# Patient Record
Sex: Female | Born: 1955 | Race: White | Hispanic: No | Marital: Married | Smoking: Never smoker
Health system: Southern US, Community
[De-identification: ages and names within clinical notes are randomized; demographics above are authoritative.]

## PROBLEM LIST (undated history)

## (undated) DIAGNOSIS — M810 Age-related osteoporosis without current pathological fracture: Secondary | ICD-10-CM

## (undated) DIAGNOSIS — Z789 Other specified health status: Secondary | ICD-10-CM

## (undated) DIAGNOSIS — R319 Hematuria, unspecified: Secondary | ICD-10-CM

## (undated) DIAGNOSIS — D649 Anemia, unspecified: Secondary | ICD-10-CM

## (undated) DIAGNOSIS — T7840XA Allergy, unspecified, initial encounter: Secondary | ICD-10-CM

## (undated) DIAGNOSIS — N912 Amenorrhea, unspecified: Secondary | ICD-10-CM

## (undated) DIAGNOSIS — N200 Calculus of kidney: Secondary | ICD-10-CM

## (undated) DIAGNOSIS — H409 Unspecified glaucoma: Secondary | ICD-10-CM

## (undated) DIAGNOSIS — M8580 Other specified disorders of bone density and structure, unspecified site: Secondary | ICD-10-CM

## (undated) HISTORY — DX: Calculus of kidney: N20.0

## (undated) HISTORY — DX: Unspecified glaucoma: H40.9

## (undated) HISTORY — DX: Hematuria, unspecified: R31.9

## (undated) HISTORY — DX: Amenorrhea, unspecified: N91.2

## (undated) HISTORY — DX: Allergy, unspecified, initial encounter: T78.40XA

## (undated) HISTORY — PX: TUBAL LIGATION: SHX77

## (undated) HISTORY — PX: COLONOSCOPY: SHX174

## (undated) HISTORY — DX: Other specified disorders of bone density and structure, unspecified site: M85.80

---

## 1999-04-03 ENCOUNTER — Other Ambulatory Visit: Admission: RE | Admit: 1999-04-03 | Discharge: 1999-04-03 | Payer: Self-pay | Admitting: Obstetrics and Gynecology

## 2000-10-13 ENCOUNTER — Other Ambulatory Visit: Admission: RE | Admit: 2000-10-13 | Discharge: 2000-10-13 | Payer: Self-pay | Admitting: Obstetrics and Gynecology

## 2001-12-01 ENCOUNTER — Other Ambulatory Visit: Admission: RE | Admit: 2001-12-01 | Discharge: 2001-12-01 | Payer: Self-pay | Admitting: Obstetrics and Gynecology

## 2003-01-03 ENCOUNTER — Other Ambulatory Visit: Admission: RE | Admit: 2003-01-03 | Discharge: 2003-01-03 | Payer: Self-pay | Admitting: Obstetrics and Gynecology

## 2004-05-22 ENCOUNTER — Other Ambulatory Visit: Admission: RE | Admit: 2004-05-22 | Discharge: 2004-05-22 | Payer: Self-pay | Admitting: Obstetrics and Gynecology

## 2005-08-11 ENCOUNTER — Other Ambulatory Visit: Admission: RE | Admit: 2005-08-11 | Discharge: 2005-08-11 | Payer: Self-pay | Admitting: Obstetrics and Gynecology

## 2005-08-26 ENCOUNTER — Encounter: Admission: RE | Admit: 2005-08-26 | Discharge: 2005-08-26 | Payer: Self-pay | Admitting: Obstetrics and Gynecology

## 2005-12-16 ENCOUNTER — Emergency Department (HOSPITAL_COMMUNITY): Admission: EM | Admit: 2005-12-16 | Discharge: 2005-12-16 | Payer: Self-pay | Admitting: Emergency Medicine

## 2005-12-20 ENCOUNTER — Ambulatory Visit: Payer: Self-pay | Admitting: Cardiovascular Disease

## 2006-01-03 ENCOUNTER — Ambulatory Visit: Payer: Self-pay | Admitting: Cardiovascular Disease

## 2006-01-03 ENCOUNTER — Encounter: Payer: Self-pay | Admitting: Internal Medicine

## 2006-01-03 ENCOUNTER — Ambulatory Visit: Payer: Self-pay

## 2007-01-31 ENCOUNTER — Encounter: Admission: RE | Admit: 2007-01-31 | Discharge: 2007-01-31 | Payer: Self-pay | Admitting: Obstetrics & Gynecology

## 2007-02-02 ENCOUNTER — Encounter: Admission: RE | Admit: 2007-02-02 | Discharge: 2007-02-02 | Payer: Self-pay | Admitting: Obstetrics & Gynecology

## 2007-04-27 ENCOUNTER — Other Ambulatory Visit: Admission: RE | Admit: 2007-04-27 | Discharge: 2007-04-27 | Payer: Self-pay | Admitting: Obstetrics & Gynecology

## 2007-05-03 ENCOUNTER — Encounter: Admission: RE | Admit: 2007-05-03 | Discharge: 2007-05-03 | Payer: Self-pay | Admitting: Obstetrics & Gynecology

## 2007-08-30 ENCOUNTER — Encounter: Admission: RE | Admit: 2007-08-30 | Discharge: 2007-08-30 | Payer: Self-pay | Admitting: Obstetrics & Gynecology

## 2009-01-27 ENCOUNTER — Encounter: Admission: RE | Admit: 2009-01-27 | Discharge: 2009-01-27 | Payer: Self-pay | Admitting: Obstetrics & Gynecology

## 2009-01-27 ENCOUNTER — Other Ambulatory Visit: Admission: RE | Admit: 2009-01-27 | Discharge: 2009-01-27 | Payer: Self-pay | Admitting: Obstetrics & Gynecology

## 2010-03-09 ENCOUNTER — Encounter: Admission: RE | Admit: 2010-03-09 | Discharge: 2010-03-09 | Payer: Self-pay | Admitting: Obstetrics & Gynecology

## 2010-03-24 ENCOUNTER — Encounter (INDEPENDENT_AMBULATORY_CARE_PROVIDER_SITE_OTHER): Payer: Self-pay | Admitting: *Deleted

## 2010-05-13 ENCOUNTER — Encounter (INDEPENDENT_AMBULATORY_CARE_PROVIDER_SITE_OTHER): Payer: Self-pay | Admitting: *Deleted

## 2010-05-15 ENCOUNTER — Ambulatory Visit: Payer: Self-pay | Admitting: Internal Medicine

## 2010-05-29 ENCOUNTER — Ambulatory Visit: Payer: Self-pay | Admitting: Internal Medicine

## 2010-10-18 ENCOUNTER — Encounter: Payer: Self-pay | Admitting: Obstetrics & Gynecology

## 2010-10-29 NOTE — Letter (Signed)
Summary: Previsit letter  [facility]  [address] [place], [place] [address]   Phone: [phone]  Fax: [phone]       03/24/2010 MRN: [id number]  [facility] [address] [place], [place]  [address]  Dear Ms. Colleen Elliott,  Welcome to the Gastroenterology Division at [facility].    You are scheduled to see a nurse for your pre-procedure visit on 05-15-10 at 4:30p.m. on the 3rd floor at [facility], [address]  We ask that you try to arrive at our office 15 minutes prior to your appointment time to allow for check-in.  Your nurse visit will consist of discussing your medical and surgical history, your immediate family medical history, and your medications.    Please bring a complete list of all your medications or, if you prefer, bring the medication bottles and we will list them.  We will need to be aware of both prescribed and over the counter drugs.  We will need to know exact dosage information as well.  If you are on blood thinners (Coumadin, Plavix, Aggrenox, Ticlid, etc.) please call our office today/prior to your appointment, as we need to consult with your physician about holding your medication.   Please be prepared to read and sign documents such as consent forms, a financial agreement, and acknowledgement forms.  If necessary, and with your consent, a friend or relative is welcome to sit-in on the nurse visit with you.  Please bring your insurance card so that we may make a copy of it.  If your insurance requires a referral to see a specialist, please bring your referral form from your primary care physician.  No co-pay is required for this nurse visit.     If you cannot keep your appointment, please call [phone] to cancel or reschedule prior to your appointment date.  This allows [place] the opportunity to schedule an appointment for another patient in need of care.    Thank you for choosing [facility] for your medical needs.   We appreciate the opportunity to care for you.  Please visit [place] at our website  to learn more about our practice.                     Sincerely.                                                                                                                   The Gastroenterology Division

## 2010-10-29 NOTE — Miscellaneous (Signed)
Summary: Previsit prep/RM  Clinical Lists Changes  Medications: Added new medication of MOVIPREP 100 GM  SOLR (PEG-KCL-NACL-NASULF-NA ASC-C) As per prep instructions. - Signed Rx of MOVIPREP 100 GM  SOLR (PEG-KCL-NACL-NASULF-NA ASC-C) As per prep instructions.;  #1 x 0;  Signed;  Entered by: Sherren Kerns RN;  Authorized by: Iva Boop MD, [facility];  Method used: Electronically to [facility] [phone]*, [address] [place], [place], [place]  [address], Ph: [phone] or [phone], Fax: [phone] Observations: Added new observation of ALLERGY REV: Done (05/15/2010 16:18) Added new observation of NKA: T (05/15/2010 16:18)    Prescriptions: MOVIPREP 100 GM  SOLR (PEG-KCL-NACL-NASULF-NA ASC-C) As per prep instructions.  #1 x 0   Entered by:   Sherren Kerns RN   Authorized by:   Iva Boop MD, [facility]   Signed by:   Sherren Kerns RN on 05/15/2010   Method used:   Electronically to        [facility] [phone]* (retail)       [address]       [place], [place]  [address]       Ph: [phone] or [phone]       Fax: [phone]   RxID:   [id number]

## 2010-10-29 NOTE — Procedures (Signed)
Summary: Colonoscopy  Patient: Colleen Elliott Note: All result statuses are Final unless otherwise noted.  Tests: (1) Colonoscopy (COL)   COL Colonoscopy           DONE     [facility]     [address]     [place], [place]  [address]           COLONOSCOPY PROCEDURE REPORT           PATIENT:  Colleen Elliott, Colleen Elliott  MR#:  [id number]     BIRTHDATE:  03-Aug-1956, 54 yrs. old  GENDER:  female     ENDOSCOPIST:  Iva Boop, MD, [facility]     REF. BY:  Leda Quail, M.D.     PROCEDURE DATE:  05/29/2010     PROCEDURE:  Colonoscopy 19[address]     ASA CLASS:  Class I     INDICATIONS:  Routine Risk Screening     MEDICATIONS:   Fentanyl 50 mcg IV, Versed 5 mg IV           DESCRIPTION OF PROCEDURE:   After the risks benefits and     alternatives of the procedure were thoroughly explained, informed     consent was obtained.  Digital rectal exam was performed and     revealed no abnormalities.   The LB PCF-H180AL [id number] endoscope     was introduced through the anus and advanced to the cecum, which     was identified by both the appendix and ileocecal valve, without     limitations.  The quality of the prep was excellent, using     MoviPrep.  The instrument was then slowly withdrawn as the colon     was fully examined. Insertion: 4:01 minutes Withdrawal: 6:48     minutes     <<PROCEDUREIMAGES>>           FINDINGS:  Mild diverticulosis was found in the right colon.  This     was otherwise a normal examination of the colon. Including right     colon retroflexion.   Retroflexed views in the rectum revealed no     abnormalities.    The scope was then withdrawn from the patient     and the procedure completed.           COMPLICATIONS:  None     ENDOSCOPIC IMPRESSION:     1) Mild diverticulosis in the right colon     2) Otherwise normal examination with excellent prep           REPEAT EXAM:  In 10 year(s) for routine screening colonoscopy.           Iva Boop, MD, [facility]           CC:   Leda Quail, MD and The Patient           n.     eSIGNED:   Iva Boop at 05/29/2010 09:19 AM           Zoe Lan, [id number]  Note: An exclamation mark (!) indicates a result that was not dispersed into the flowsheet. Document Creation Date: 05/29/2010 9:19 AM _______________________________________________________________________  (1) Order result status: Final Collection or observation date-time: 05/29/2010 09:10 Requested date-time:  Receipt date-time:  Reported date-time:  Referring Physician:   Ordering Physician: Stan Head [phone]) Specimen Source:  Source: Launa Grill Order Number: [id number] Lab site:   Appended Document: Colonoscopy    Clinical Lists Changes  Observations: Added new  observation of COLONNXTDUE: 05/2020 (05/29/2010 10:05)

## 2010-10-29 NOTE — Letter (Signed)
Summary: [facility] Instructions  [facility]  [address] [place], [place] [address]   Phone: [phone]  Fax: [phone]       SHINITA MAC    [birth year]    MRN: [id number]        Procedure Day /Date:  Friday 05/29/2010     Arrival Time: 8:00 am      Procedure Time: 9:00 am     Location of Procedure:                    _ x_  [facility] (4th Floor)                        PREPARATION FOR COLONOSCOPY WITH MOVIPREP   Starting 5 days prior to your procedure Sunday 8/28 do not eat nuts, seeds, popcorn, corn, beans, peas,  salads, or any raw vegetables.  Do not take any fiber supplements (e.g. Metamucil, Citrucel, and Benefiber).  THE DAY BEFORE YOUR PROCEDURE         DATE: Thursday 9/1  1.  Drink clear liquids the entire day-NO SOLID FOOD  2.  Do not drink anything colored red or purple.  Avoid juices with pulp.  No orange juice.  3.  Drink at least 64 oz. (8 glasses) of fluid/clear liquids during the day to prevent dehydration and help the prep work efficiently.  CLEAR LIQUIDS INCLUDE: Water Jello Ice Popsicles Tea (sugar ok, no milk/cream) Powdered fruit flavored drinks Coffee (sugar ok, no milk/cream) Gatorade Juice: apple, white grape, white cranberry  Lemonade Clear bullion, consomm, broth Carbonated beverages (any kind) Strained chicken noodle soup Hard Candy                             4.  In the morning, mix first dose of MoviPrep solution:    Empty 1 Pouch A and 1 Pouch B into the disposable container    Add lukewarm drinking water to the top line of the container. Mix to dissolve    Refrigerate (mixed solution should be used within 24 hrs)  5.  Begin drinking the prep at 5:00 p.m. The MoviPrep container is divided by 4 marks.   Every 15 minutes drink the solution down to the next mark (approximately 8 oz) until the full liter is complete.   6.  Follow completed prep with 16 oz of clear liquid of your choice (Nothing red  or purple).  Continue to drink clear liquids until bedtime.  7.  Before going to bed, mix second dose of MoviPrep solution:    Empty 1 Pouch A and 1 Pouch B into the disposable container    Add lukewarm drinking water to the top line of the container. Mix to dissolve    Refrigerate  THE DAY OF YOUR PROCEDURE      DATE: Friday 9/2  Beginning at 4:00 a.m. (5 hours before procedure):         1. Every 15 minutes, drink the solution down to the next mark (approx 8 oz) until the full liter is complete.  2. Follow completed prep with 16 oz. of clear liquid of your choice.    3. You may drink clear liquids until 7:00 am (2 HOURS BEFORE PROCEDURE).   MEDICATION INSTRUCTIONS  Unless otherwise instructed, you should take regular prescription medications with a small sip of water   as early as possible the morning of  your procedure.   Additional medication instructions: n/a         OTHER INSTRUCTIONS  You will need a responsible adult at least 55 years of age to accompany you and drive you home.   This person must remain in the waiting room during your procedure.  Wear loose fitting clothing that is easily removed.  Leave jewelry and other valuables at home.  However, you may wish to bring a book to read or  an iPod/MP3 player to listen to music as you wait for your procedure to start.  Remove all body piercing jewelry and leave at home.  Total time from sign-in until discharge is approximately 2-3 hours.  You should go home directly after your procedure and rest.  You can resume normal activities the  day after your procedure.  The day of your procedure you should not:   Drive   Make legal decisions   Operate machinery   Drink alcohol   Return to work  You will receive specific instructions about eating, activities and medications before you leave.    The above instructions have been reviewed and explained to me by   Sherren Kerns RN  May 15, 2010 4:34  PM    I fully understand and can verbalize these instructions _____________________________ Date _________

## 2011-08-03 ENCOUNTER — Other Ambulatory Visit: Payer: Self-pay | Admitting: Obstetrics & Gynecology

## 2011-08-03 DIAGNOSIS — M8580 Other specified disorders of bone density and structure, unspecified site: Secondary | ICD-10-CM

## 2011-08-03 DIAGNOSIS — Z1231 Encounter for screening mammogram for malignant neoplasm of breast: Secondary | ICD-10-CM

## 2011-08-23 ENCOUNTER — Ambulatory Visit: Payer: Self-pay

## 2011-09-08 ENCOUNTER — Ambulatory Visit
Admission: RE | Admit: 2011-09-08 | Discharge: 2011-09-08 | Disposition: A | Discharge status: Home / Self Care | Payer: Self-pay | Source: Ambulatory Visit | Attending: Obstetrics & Gynecology | Admitting: Obstetrics & Gynecology

## 2011-09-08 DIAGNOSIS — M8580 Other specified disorders of bone density and structure, unspecified site: Secondary | ICD-10-CM

## 2011-09-08 DIAGNOSIS — Z1231 Encounter for screening mammogram for malignant neoplasm of breast: Secondary | ICD-10-CM

## 2012-02-28 ENCOUNTER — Emergency Department (HOSPITAL_COMMUNITY)
Admission: EM | Admit: 2012-02-28 | Discharge: 2012-02-28 | Disposition: A | Discharge status: Home / Self Care | Payer: 59 | Attending: Emergency Medicine | Admitting: Emergency Medicine

## 2012-02-28 ENCOUNTER — Emergency Department (HOSPITAL_COMMUNITY): Payer: 59

## 2012-02-28 ENCOUNTER — Encounter (HOSPITAL_COMMUNITY): Payer: Self-pay | Admitting: *Deleted

## 2012-02-28 DIAGNOSIS — N390 Urinary tract infection, site not specified: Secondary | ICD-10-CM | POA: Insufficient documentation

## 2012-02-28 DIAGNOSIS — R110 Nausea: Secondary | ICD-10-CM | POA: Insufficient documentation

## 2012-02-28 DIAGNOSIS — R109 Unspecified abdominal pain: Secondary | ICD-10-CM | POA: Insufficient documentation

## 2012-02-28 DIAGNOSIS — R10816 Epigastric abdominal tenderness: Secondary | ICD-10-CM | POA: Insufficient documentation

## 2012-02-28 LAB — URINALYSIS, ROUTINE W REFLEX MICROSCOPIC
Bilirubin Urine: NEGATIVE
Ketones, ur: NEGATIVE mg/dL
Nitrite: NEGATIVE
Specific Gravity, Urine: 1.011 (ref 1.005–1.030)
Urobilinogen, UA: 0.2 mg/dL (ref 0.0–1.0)

## 2012-02-28 LAB — COMPREHENSIVE METABOLIC PANEL
ALT: 31 U/L (ref 0–35)
AST: 45 U/L — ABNORMAL HIGH (ref 0–37)
Alkaline Phosphatase: 81 U/L (ref 39–117)
CO2: 28 mEq/L (ref 19–32)
Calcium: 9.3 mg/dL (ref 8.4–10.5)
Chloride: 104 mEq/L (ref 96–112)
GFR calc Af Amer: >90 mL/min (ref >90)
GFR calc non Af Amer: >90 mL/min (ref >90)
Glucose, Bld: 87 mg/dL (ref 70–99)
Potassium: 3.9 mEq/L (ref 3.5–5.1)
Sodium: 140 mEq/L (ref 135–145)
Total Bilirubin: 0.2 mg/dL — ABNORMAL LOW (ref 0.3–1.2)

## 2012-02-28 LAB — DIFFERENTIAL
Basophils Absolute: 0 10*3/uL (ref 0.0–0.1)
Eosinophils Relative: 2 % (ref 0–5)
Lymphocytes Relative: 27 % (ref 12–46)
Lymphs Abs: 2 10*3/uL (ref 0.7–4.0)
Neutro Abs: 4.9 10*3/uL (ref 1.7–7.7)

## 2012-02-28 LAB — CBC
MCV: 94 fL (ref 78.0–100.0)
Platelets: 281 10*3/uL (ref 150–400)
RBC: 3.84 MIL/uL — ABNORMAL LOW (ref 3.87–5.11)
RDW: 13.2 % (ref 11.5–15.5)
WBC: 7.4 10*3/uL (ref 4.0–10.5)

## 2012-02-28 LAB — URINE MICROSCOPIC-ADD ON

## 2012-02-28 MED ORDER — ONDANSETRON HCL 4 MG/2ML IJ SOLN
4.0000 mg | Freq: Once | INTRAMUSCULAR | Status: AC
  Administered 2012-02-28: 4 mg via INTRAVENOUS
  Filled 2012-02-28: qty 2

## 2012-02-28 MED ORDER — CEPHALEXIN 500 MG PO CAPS: 500.0000 mg | ORAL_CAPSULE | Freq: Three times a day (TID) | ORAL | Status: DC

## 2012-02-28 MED ORDER — DEXTROSE 5 % IV SOLN
1.0000 g | INTRAVENOUS | Status: DC
  Administered 2012-02-28: 1 g via INTRAVENOUS
  Filled 2012-02-28: qty 10

## 2012-02-28 MED ORDER — HYDROMORPHONE HCL PF 1 MG/ML IJ SOLN
1.0000 mg | Freq: Once | INTRAMUSCULAR | Status: AC
  Administered 2012-02-28: 1 mg via INTRAVENOUS
  Filled 2012-02-28: qty 1

## 2012-02-28 MED ORDER — HYDROCODONE-ACETAMINOPHEN 5-325 MG PO TABS: 1.0000 | ORAL_TABLET | Freq: Four times a day (QID) | ORAL | Status: DC | PRN

## 2012-02-28 MED ORDER — SODIUM CHLORIDE 0.9 % IV SOLN
1000.0000 mL | Freq: Once | INTRAVENOUS | Status: AC
  Administered 2012-02-28: 1000 mL via INTRAVENOUS

## 2012-02-28 MED ORDER — SODIUM CHLORIDE 0.9 % IV SOLN
1000.0000 mL | INTRAVENOUS | Status: DC
  Administered 2012-02-28: 1000 mL via INTRAVENOUS

## 2012-02-28 NOTE — ED Provider Notes (Addendum)
History     CSN: [id number] Arrival date & time 02/28/12  1414 First MD Initiated Contact with Patient 02/28/12 1504      Chief Complaint  Patient presents with  . Abdominal Pain    pt is here with epigastric pain radiating to back    Patient is a 56 y.o. female presenting with abdominal pain. The history is provided by the patient.  Abdominal Pain The primary symptoms of the illness include abdominal pain and nausea. The primary symptoms of the illness do not include vomiting or diarrhea.  The abdominal pain began yesterday. The pain came on suddenly. The abdominal pain is located in the epigastric region. The abdominal pain radiates to the back. Relieved by: it eventually resolved after zantac and walking. Exacerbated by: no change with food, able to eat lunch and breakfast, started again this afternoon at 1:30.  Additional symptoms associated with the illness include diaphoresis. Symptoms associated with the illness do not include chills, anorexia, urgency, hematuria or frequency.    History reviewed. No pertinent past medical history. Hx of hematuria over the years, negative workup Past Surgical History  Procedure Date  . Tubal ligation     No family history on file.  History  Substance Use Topics  . Smoking status: Not on file  . Smokeless tobacco: Not on file  . Alcohol Use: No    OB History    Grav Para Term Preterm Abortions TAB SAB Ect Mult Living                  Review of Systems  Constitutional: Positive for diaphoresis. Negative for chills.  Respiratory: Negative for cough.   Gastrointestinal: Positive for nausea and abdominal pain. Negative for vomiting, diarrhea and anorexia.  Genitourinary: Negative for urgency, frequency and hematuria.  All other systems reviewed and are negative.    Allergies  Review of patient's allergies indicates no known allergies.  Home Medications   Current Outpatient Rx  Name Route Sig Dispense Refill  . CALCIUM  CARBONATE-VITAMIN D 500-200 MG-UNIT PO TABS Oral Take 1 tablet by mouth every other day.    . CHLORPHEN-PE-ACETAMINOPHEN 2-5-325 MG PO TABS Oral Take 1 tablet by mouth every 6 (six) hours as needed. For sinus pain      BP 135/80  Pulse 77  Temp(Src) 98.8 F (37.1 C) (Oral)  Resp 17  Ht 5\' 1"  (1.549 m)  Wt 114 lb (51.71 kg)  BMI 21.54 kg/m2  SpO2 100%  Physical Exam  Nursing note and vitals reviewed. Constitutional: She appears well-developed and well-nourished. No distress.  HENT:  Head: Normocephalic and atraumatic.  Right Ear: External ear normal.  Left Ear: External ear normal.  Eyes: Conjunctivae are normal. Right eye exhibits no discharge. Left eye exhibits no discharge. No scleral icterus.  Neck: Neck supple. No tracheal deviation present.  Cardiovascular: Normal rate, regular rhythm and intact distal pulses.   Pulmonary/Chest: Effort normal and breath sounds normal. No stridor. No respiratory distress. She has no wheezes. She has no rales.  Abdominal: Soft. Bowel sounds are normal. She exhibits no distension, no ascites and no mass. There is tenderness in the epigastric area. There is CVA tenderness (left). There is no rigidity, no rebound and no guarding. No hernia.  Musculoskeletal: She exhibits no edema and no tenderness.  Neurological: She is alert. She has normal strength. No sensory deficit. Cranial nerve deficit:  no gross defecits noted. She exhibits normal muscle tone. She displays no seizure activity. Coordination normal.  Skin: Skin is warm and dry. No rash noted.  Psychiatric: She has a normal mood and affect.    ED Course  Procedures (including critical care time)  Rate: 76  Rhythm: normal sinus rhythm  QRS Axis: normal  Intervals: normal  ST/T Wave abnormalities: normal  Conduction Disutrbances:none  Narrative Interpretation:   Old EKG Reviewed: none available  Labs Reviewed  CBC - Abnormal; Notable for the following:    RBC 3.84 (*)    Hemoglobin  11.6 (*)    All other components within normal limits  COMPREHENSIVE METABOLIC PANEL - Abnormal; Notable for the following:    AST 45 (*)    Total Bilirubin 0.2 (*)    All other components within normal limits  URINALYSIS, ROUTINE W REFLEX MICROSCOPIC - Abnormal; Notable for the following:    Hgb urine dipstick SMALL (*)    Leukocytes, UA MODERATE (*)    All other components within normal limits  URINE MICROSCOPIC-ADD ON - Abnormal; Notable for the following:    Bacteria, UA MANY (*)    All other components within normal limits  DIFFERENTIAL  LIPASE, BLOOD  URINE CULTURE   US Abdomen Complete  02/28/2012  *RADIOLOGY REPORT*  Clinical Data:  Abdominal and epigastric pain.  ABDOMINAL ULTRASOUND COMPLETE  Comparison:  None.  Findings:  Gallbladder:  No gallstones, gallbladder wall thickening, or pericholecystic fluid. The sonographic Murphy's sign is negative.  Common Bile Duct:  Within normal limits in caliber.  Liver: No focal mass lesion identified.  Within normal limits in parenchymal echogenicity.  IVC:  Appears normal.  Pancreas:  No abnormality identified.  Spleen:  Within normal limits in size and echotexture.  Right kidney:  Normal in size and parenchymal echogenicity.  No evidence of mass or hydronephrosis.  Left kidney:  Normal in size and parenchymal echogenicity.  No evidence of mass or hydronephrosis.  Abdominal Aorta:  No aneurysm identified.  IMPRESSION: Negative abdominal ultrasound.  Original Report Authenticated By: Britta Mccreedy, M.D.     1. Abdominal pain   2. UTI (urinary tract infection)       MDM  No sign of cholecystitis or other acute abnormality on the [place].  Suspect her symptoms may be related to a UTI.  Will dc home on abx.  Pt instructed to return for worsening symptoms.        Celene Kras, MD 02/29/12 5784  Celene Kras, MD 03/29/12 [phone]

## 2012-02-28 NOTE — ED Notes (Signed)
Pt is here with epigastric pain which is radiating to the back and is associated with nausea and dizziness while pain was at its worst.  Pain first felt last night but subsided.  This pain returned this afternoon around 1pm and felt like stabbing pain.  Pt notes that xantac and ambulation seemed to help with this pain last night.  No sob or diaphoresis with this.

## 2012-02-28 NOTE — Discharge Instructions (Signed)

## 2012-02-28 NOTE — Progress Notes (Signed)
pt confirms Dr Christell Constant in [place] is pcp

## 2012-03-01 ENCOUNTER — Encounter (HOSPITAL_COMMUNITY): Payer: Self-pay | Admitting: Emergency Medicine

## 2012-03-01 ENCOUNTER — Inpatient Hospital Stay (HOSPITAL_COMMUNITY)
Admission: EM | Admit: 2012-03-01 | Discharge: 2012-03-04 | DRG: 442 | Disposition: A | Discharge status: Home / Self Care | Payer: 59 | Attending: Internal Medicine | Admitting: Internal Medicine

## 2012-03-01 DIAGNOSIS — R1013 Epigastric pain: Secondary | ICD-10-CM | POA: Diagnosis present

## 2012-03-01 DIAGNOSIS — D6489 Other specified anemias: Secondary | ICD-10-CM | POA: Diagnosis present

## 2012-03-01 DIAGNOSIS — R7401 Elevation of levels of liver transaminase levels: Secondary | ICD-10-CM | POA: Diagnosis present

## 2012-03-01 DIAGNOSIS — K754 Autoimmune hepatitis: Principal | ICD-10-CM | POA: Diagnosis present

## 2012-03-01 DIAGNOSIS — R1110 Vomiting, unspecified: Secondary | ICD-10-CM

## 2012-03-01 DIAGNOSIS — R109 Unspecified abdominal pain: Secondary | ICD-10-CM

## 2012-03-01 DIAGNOSIS — R7402 Elevation of levels of lactic acid dehydrogenase (LDH): Secondary | ICD-10-CM | POA: Diagnosis present

## 2012-03-01 DIAGNOSIS — B199 Unspecified viral hepatitis without hepatic coma: Secondary | ICD-10-CM | POA: Diagnosis present

## 2012-03-01 DIAGNOSIS — N390 Urinary tract infection, site not specified: Secondary | ICD-10-CM | POA: Diagnosis present

## 2012-03-01 DIAGNOSIS — R112 Nausea with vomiting, unspecified: Secondary | ICD-10-CM | POA: Diagnosis present

## 2012-03-01 DIAGNOSIS — B179 Acute viral hepatitis, unspecified: Secondary | ICD-10-CM | POA: Diagnosis present

## 2012-03-01 HISTORY — DX: Anemia, unspecified: D64.9

## 2012-03-01 HISTORY — DX: Other specified health status: Z78.9

## 2012-03-01 NOTE — ED Notes (Signed)
Pt was seen Monday for suspected gallbladder issues. Pt has had nausea and pain since she left and feels as if the pain medication may have contributed to her nausea. States that the medication gave her no relief. Does not seem to get better or worse with food.

## 2012-03-02 ENCOUNTER — Emergency Department (HOSPITAL_COMMUNITY): Payer: 59

## 2012-03-02 ENCOUNTER — Inpatient Hospital Stay (HOSPITAL_COMMUNITY): Payer: 59

## 2012-03-02 ENCOUNTER — Encounter (HOSPITAL_COMMUNITY): Payer: Self-pay | Admitting: Neurology

## 2012-03-02 DIAGNOSIS — R1013 Epigastric pain: Secondary | ICD-10-CM | POA: Diagnosis present

## 2012-03-02 DIAGNOSIS — K759 Inflammatory liver disease, unspecified: Secondary | ICD-10-CM

## 2012-03-02 DIAGNOSIS — E782 Mixed hyperlipidemia: Secondary | ICD-10-CM

## 2012-03-02 DIAGNOSIS — R112 Nausea with vomiting, unspecified: Secondary | ICD-10-CM

## 2012-03-02 DIAGNOSIS — R7401 Elevation of levels of liver transaminase levels: Secondary | ICD-10-CM | POA: Clinically undetermined

## 2012-03-02 LAB — CBC
HCT: 32.2 % — ABNORMAL LOW (ref 36.0–46.0)
Hemoglobin: 11.4 g/dL — ABNORMAL LOW (ref 12.0–15.0)
Hemoglobin: 10.4 g/dL — ABNORMAL LOW (ref 12.0–15.0)
MCH: 29.9 pg (ref 26.0–34.0)
MCHC: 32.2 g/dL (ref 30.0–36.0)
MCV: 92.9 fL (ref 78.0–100.0)
MCV: 92.8 fL (ref 78.0–100.0)
Platelets: 271 10*3/uL (ref 150–400)
RBC: 3.81 MIL/uL — ABNORMAL LOW (ref 3.87–5.11)
RBC: 3.47 MIL/uL — ABNORMAL LOW (ref 3.87–5.11)
WBC: 5.6 10*3/uL (ref 4.0–10.5)

## 2012-03-02 LAB — COMPREHENSIVE METABOLIC PANEL
AST: 190 U/L — ABNORMAL HIGH (ref 0–37)
Albumin: 3.2 g/dL — ABNORMAL LOW (ref 3.5–5.2)
BUN: 9 mg/dL (ref 6–23)
CO2: 27 mEq/L (ref 19–32)
Calcium: 9.3 mg/dL (ref 8.4–10.5)
Creatinine, Ser: 0.6 mg/dL (ref 0.50–1.10)
Creatinine, Ser: 0.65 mg/dL (ref 0.50–1.10)
GFR calc Af Amer: >90 mL/min (ref >90)
GFR calc non Af Amer: >90 mL/min (ref >90)
Glucose, Bld: 118 mg/dL — ABNORMAL HIGH (ref 70–99)
Total Protein: 7.1 g/dL (ref 6.0–8.3)
Total Protein: 6 g/dL (ref 6.0–8.3)

## 2012-03-02 LAB — DIFFERENTIAL
Eosinophils Relative: 1 % (ref 0–5)
Lymphocytes Relative: 37 % (ref 12–46)
Lymphs Abs: 2 10*3/uL (ref 0.7–4.0)
Monocytes Absolute: 0.4 10*3/uL (ref 0.1–1.0)
Monocytes Relative: 7 % (ref 3–12)

## 2012-03-02 LAB — URINALYSIS, ROUTINE W REFLEX MICROSCOPIC
Bilirubin Urine: NEGATIVE
Ketones, ur: NEGATIVE mg/dL
Protein, ur: NEGATIVE mg/dL
Urobilinogen, UA: 1 mg/dL (ref 0.0–1.0)

## 2012-03-02 LAB — POCT I-STAT, CHEM 8
Chloride: 103 mEq/L (ref 96–112)
HCT: 36 % (ref 36.0–46.0)
Hemoglobin: 12.2 g/dL (ref 12.0–15.0)
Potassium: 3.8 mEq/L (ref 3.5–5.1)
Sodium: 141 mEq/L (ref 135–145)

## 2012-03-02 LAB — URINE MICROSCOPIC-ADD ON

## 2012-03-02 MED ORDER — ENOXAPARIN SODIUM 40 MG/0.4ML ~~LOC~~ SOLN
40.0000 mg | SUBCUTANEOUS | Status: DC
  Administered 2012-03-02 – 2012-03-04 (×3): 40 mg via SUBCUTANEOUS
  Filled 2012-03-02 (×3): qty 0.4

## 2012-03-02 MED ORDER — CEPHALEXIN 500 MG PO CAPS
500.0000 mg | ORAL_CAPSULE | Freq: Three times a day (TID) | ORAL | Status: DC
  Administered 2012-03-02 – 2012-03-04 (×7): 500 mg via ORAL
  Filled 2012-03-02 (×9): qty 1

## 2012-03-02 MED ORDER — IOHEXOL 300 MG/ML  SOLN
100.0000 mL | Freq: Once | INTRAMUSCULAR | Status: AC | PRN
  Administered 2012-03-02: 100 mL via INTRAVENOUS

## 2012-03-02 MED ORDER — ONDANSETRON HCL 4 MG/2ML IJ SOLN
4.0000 mg | Freq: Four times a day (QID) | INTRAMUSCULAR | Status: DC | PRN
  Administered 2012-03-02 – 2012-03-03 (×3): 4 mg via INTRAVENOUS
  Filled 2012-03-02 (×4): qty 2

## 2012-03-02 MED ORDER — DOCUSATE SODIUM 100 MG PO CAPS
100.0000 mg | ORAL_CAPSULE | Freq: Two times a day (BID) | ORAL | Status: DC
  Administered 2012-03-02 – 2012-03-04 (×5): 100 mg via ORAL
  Filled 2012-03-02 (×6): qty 1

## 2012-03-02 MED ORDER — HYDROMORPHONE HCL PF 1 MG/ML IJ SOLN
1.0000 mg | INTRAMUSCULAR | Status: DC | PRN
  Filled 2012-03-02: qty 1

## 2012-03-02 MED ORDER — PANTOPRAZOLE SODIUM 40 MG IV SOLR
40.0000 mg | Freq: Once | INTRAVENOUS | Status: AC
  Administered 2012-03-02: 40 mg via INTRAVENOUS
  Filled 2012-03-02: qty 40

## 2012-03-02 MED ORDER — PANTOPRAZOLE SODIUM 40 MG IV SOLR
40.0000 mg | Freq: Two times a day (BID) | INTRAVENOUS | Status: DC
  Administered 2012-03-02 – 2012-03-03 (×3): 40 mg via INTRAVENOUS
  Filled 2012-03-02 (×4): qty 40

## 2012-03-02 MED ORDER — ALUM & MAG HYDROXIDE-SIMETH 200-200-20 MG/5ML PO SUSP: 30.0000 mL | Freq: Four times a day (QID) | ORAL | Status: DC | PRN

## 2012-03-02 MED ORDER — ONDANSETRON HCL 4 MG/2ML IJ SOLN
4.0000 mg | Freq: Once | INTRAMUSCULAR | Status: AC
  Administered 2012-03-02: 4 mg via INTRAVENOUS
  Filled 2012-03-02: qty 2

## 2012-03-02 MED ORDER — FENTANYL CITRATE 0.05 MG/ML IJ SOLN
12.5000 ug | Freq: Once | INTRAMUSCULAR | Status: AC
  Administered 2012-03-02: 12.5 ug via INTRAVENOUS
  Filled 2012-03-02: qty 2

## 2012-03-02 MED ORDER — SODIUM CHLORIDE 0.9 % IV SOLN
INTRAVENOUS | Status: DC
  Administered 2012-03-02: 10:00:00 via INTRAVENOUS

## 2012-03-02 MED ORDER — OXYCODONE HCL 5 MG PO TABS
5.0000 mg | ORAL_TABLET | Freq: Once | ORAL | Status: AC
  Administered 2012-03-02: 5 mg via ORAL
  Filled 2012-03-02: qty 1

## 2012-03-02 MED ORDER — OXYCODONE HCL 5 MG PO TABS
5.0000 mg | ORAL_TABLET | ORAL | Status: DC | PRN
  Administered 2012-03-02 – 2012-03-03 (×3): 5 mg via ORAL
  Filled 2012-03-02 (×3): qty 1

## 2012-03-02 MED ORDER — ZOLPIDEM TARTRATE 5 MG PO TABS: 5.0000 mg | ORAL_TABLET | Freq: Every evening | ORAL | Status: DC | PRN

## 2012-03-02 MED ORDER — FENTANYL CITRATE 0.05 MG/ML IJ SOLN
12.5000 ug | Freq: Once | INTRAMUSCULAR | Status: AC
  Administered 2012-03-02: 12.5 ug via INTRAVENOUS

## 2012-03-02 MED ORDER — GI COCKTAIL ~~LOC~~
30.0000 mL | Freq: Once | ORAL | Status: AC
  Administered 2012-03-02: 30 mL via ORAL
  Filled 2012-03-02: qty 30

## 2012-03-02 MED ORDER — FLEET ENEMA 7-19 GM/118ML RE ENEM: 1.0000 | ENEMA | Freq: Once | RECTAL | Status: AC | PRN

## 2012-03-02 MED ORDER — POLYETHYLENE GLYCOL 3350 17 G PO PACK
17.0000 g | PACK | Freq: Every day | ORAL | Status: DC | PRN
  Filled 2012-03-02: qty 1

## 2012-03-02 NOTE — Progress Notes (Signed)
Patient seen and examined this morning. Admission H&P as outlined above noted.  Patient still has epigastric pain radiating to the back with nausea but is better with pain medications and antiemetics.  Labs significant for abnormal LFTs. Imaging suggestive of hepatitis. No cholecystitis , GB or CBD stones. Normal pancreas on imaging and lipase level Symptoms possibly related to acute hepatitis likely viral etiology.  Hepatitis panel sent  patient denies any sick contacts, recent travel, any hepatotoxic medications, risk factors for hepatitis B or C. denies etoh use  Continue with conservative management including pain control with when necessary oxycodone, [avoid tylenol or NSADs ] continue IV fluids   start clear liquids Continue PPI Follow liver function, hepatitis panel and labs sent on admission.   UA suggestive of UTI. Started on keflex. Pending culture

## 2012-03-02 NOTE — Progress Notes (Signed)
This serves as History and Physical even though written on a "progress note"  CC: abdominal pain and nausea  Subjective: Colleen Elliott is a 56 yo WF with a benign past medical history who presents to the [facility] ED with c/o abdominal pain and nausea. She was here at ED on 02/28/12 for basically the same sx and was found to have a UTI. She was d/c'd home on Keflex and Hydrocodone/acet. She reports feeling about the same until Tues 02/29/12 when she had no pain and no nausea. However, on 03/01/12, her sx returned and she has also vomited. Therefore, she returned to the ED. She continues her Keflex.  Tonight she reports a return of the above sx with abd pain rated as a 10/10. She has nausea without vomiting since arrival. Her pain is dull/gnawing located mostly in the epigastric area. She denies frequent heartburn. She has had decreased appetite and intake, basically, only eating crackers and drinking Sprite x 3 days. Last BM 2 days ago. No PMHx of GERD, ulcer, or surgery other than a tubal ligation many years ago. She no longer menstruates.   She has no risk factors for Hepatitis (? Shown on CT) and LFTs elevated here. She denies hx of IVDA, sexual promiscuity, intake of questionable foods, travel out of the [place]. She denies use of Tylenol or Ibuprofen, except on a rare occasion. No use of Benadryl.   Therefore, because of the patient's sx and tests results, [facility] are asked to admit patient for further tx and evaluation.   PMHx: anemia (usually around 10 Hgb), multiple UTIs PSHx: tubal ligation PFHx: CAD, diabetes SHx: married x 18 years, monogamous x 18 years. Lives in [place], [place]. Grown child and has a grandchild. Still employed. Rare ETOH, never smoked, denies illicit drugs.   ROS: Gen: Denies f/c. Denies increased fatigue.  HEENT: + Headache since 02/28/12. Denies cold sx, other recent sickness, sorethroat, swollen glands, asymmetrical face, vision changes.  Card: Denies chest pain, SOB, orthopnea,  DOE, syncope, palpitations Resp: Denies SOB, cough, wheezing, sputum production Abd: + pain mostly epigastric, nausea, some vomiting. Denies BRBPR, constipation. MSK: Denies joint pain, erythema, heat or swelling to joints.  Neuro: Denies numbness, tingling, weakness, LOC, speech changes, gait disturbance. Psych: Denies depression or anxiety.    Objective:  Vital signs in last 24 hours: Temp:  [97.9 F (36.6 C)-98.6 F (37 C)] 98.6 F (37 C) (06/06 0235) Pulse Rate:  [68-83] 68  (06/06 0235) Resp:  [12-18] 12  (06/06 0235) BP: (99-119)/(46-71) 99/46 mmHg (06/06 0235) SpO2:  [100 %] 100 % (06/06 0235) Weight change:    PE:  General: VSS. WDWN WF in NAD. Alert and oriented. Appears stated age. Lying on stretcher. Appears well but fatigued.   HEENT: [place], AT. Neck supple without anterior cervical lymphadenopathy. No icterus noted. Card: S1S2 without MRG. RRR. No carotid bruits noted.  Resp: Normal resp effort. Lungs CTA.  Abd: BS + x 4 quads, slightly hypoactive in lower quads. Soft, ND. Tender with palpation to the RLQ and epigastric area. No rebound, guarding. Murphy's and McBurney's negative. MSK: MOE x 4. Joints without swelling or redness.   Neuro: PERRL, CN 2-12 grossly intact. No noted focal neuro deficits.  Psych: Affect light, pleasant and cooperative  Lab Results:  Basename 03/02/12 0037 03/01/12 2245 02/28/12 1540  WBC -- 8.3 7.4  HGB 12.2 11.4* --  HCT 36.0 35.4* --  PLT -- 271 281   BMET  Basename 03/02/12 0037 03/01/12 2245 02/28/12  1540  NA 141 138 --  K 3.8 3.9 --  CL 103 103 --  CO2 -- 27 28  GLUCOSE 111* 118* --  BUN 12 11 --  CREATININE 0.70 0.60 --  CALCIUM -- 9.3 9.3    Studies/Results: Ct Abdomen Pelvis W Contrast  03/02/2012  *RADIOLOGY REPORT*  Clinical Data: Abdominal pain and nausea.  CT ABDOMEN AND PELVIS WITH CONTRAST  Technique:  Multidetector CT imaging of the abdomen and pelvis was performed following the standard protocol during bolus  administration of intravenous contrast.  Contrast: OMNIPAQUE IOHEXOL 300 MG/ML  SOLN  Comparison: Abdominal ultrasound 02/28/2012.  Findings: The lung bases are clear.  The liver is abnormal.  There is mild periportal edema, heterogeneous attenuation and possible mild diffuse edema. Findings could be due to inflammatory process such as hepatitis. No focal lesions or intrahepatic biliary dilatation.  Recommend correlation with liver function studies.  The gallbladder appears normal.  Mild rim of edema between the gallbladder and the liver. The common bile duct is normal in caliber.  The pancreas is normal. The spleen is normal in size.  No focal lesions.  The adrenal glands and kidneys are normal.  The stomach, duodenum, small bowel and colon are unremarkable. There is moderate stool throughout the colon.  No mesenteric or retroperitoneal masses or adenopathy.  The aorta is normal in caliber.  The major branch vessels are normal.  The portal and splenic veins are patent.  The uterus and ovaries are normal.  The bladder is normal.  No pelvic mass or lymphadenopathy.  No significant free pelvic fluid collections.  No inguinal mass or hernia.  The bony structures are intact.  IMPRESSION:  1.  Findings suspicious for inflammatory hepatic process such as hepatitis.  Recommend correlation with liver function studies. 2.  No findings for biliary obstruction. 3.  No other significant abdominal/pelvic findings.  Original Report Authenticated By: P. Loralie Champagne, M.D.    Medications: reviewed  Assessment/Plan: 1. Abdominal pain, mostly epigastric. Likely Hepatitis ?A. Pt doesn't drink ETOH and has not risk factors for Hep B or C. [place] and CT negative for gallbladder issues and exam is not consistent with this. Pancreas normal on CT and lipase normal. For now, will make her NPO except sips and chips in case a procedure is needed tomorrow. Hydrate with IVF. Continue pain and nausea meds. Protonix. May need to Consult  GI. Check ANA, ESR to r/o autoimmune Hepatitis.  2. Transaminitis-LFTs are high. ? Etiology. Denies risk factors for Hepatitis, but CT favors this. Will check tylenol level. Hep panel is pending. Recheck CMP later today. Follow LFTs closely. Avoid Acetaminophen products and NSAIDs.  3. Hx recent UTI-UA pending today, urine culture from previous ED visit is pending. Continue Keflex for now. 4. Prophylaxis-Lovenox.     Maren Reamer 03/02/2012, 5:13 AM Case discussed with Dr. Toniann Fail who agrees with plan. Attending, Dr. Gonzella Lex, will discuss with him also and assign cosign to him.

## 2012-03-02 NOTE — Progress Notes (Signed)
Patient complaining of a headache.  The only prn ordered for pain is dilaudid.  Patient reports that dilaudid is too strong for her and is requesting something else for pain.  MD notified.  Awaiting new orders.

## 2012-03-02 NOTE — H&P (Signed)
H&P note written on a progress note template. Please see that note for 03/02/12 for complete H&P.

## 2012-03-03 DIAGNOSIS — R112 Nausea with vomiting, unspecified: Secondary | ICD-10-CM

## 2012-03-03 DIAGNOSIS — R1013 Epigastric pain: Secondary | ICD-10-CM

## 2012-03-03 DIAGNOSIS — K759 Inflammatory liver disease, unspecified: Secondary | ICD-10-CM

## 2012-03-03 LAB — HEPATIC FUNCTION PANEL
AST: 93 U/L — ABNORMAL HIGH (ref 0–37)
Albumin: 2.9 g/dL — ABNORMAL LOW (ref 3.5–5.2)
Alkaline Phosphatase: 108 U/L (ref 39–117)
Total Bilirubin: 0.3 mg/dL (ref 0.3–1.2)

## 2012-03-03 LAB — TSH: TSH: 1.542 u[IU]/mL (ref 0.350–4.500)

## 2012-03-03 LAB — VITAMIN B12: Vitamin B-12: 309 pg/mL (ref 211–911)

## 2012-03-03 LAB — HEPATITIS PANEL, ACUTE
HCV Ab: NEGATIVE
Hep A IgM: NEGATIVE
Hepatitis B Surface Ag: NEGATIVE

## 2012-03-03 LAB — IRON AND TIBC
Iron: 159 ug/dL — ABNORMAL HIGH (ref 42–135)
Saturation Ratios: 57 % — ABNORMAL HIGH (ref 20–55)

## 2012-03-03 MED ORDER — SODIUM CHLORIDE 0.9 % IV BOLUS (SEPSIS)
500.0000 mL | Freq: Once | INTRAVENOUS | Status: AC
  Administered 2012-03-03: 500 mL via INTRAVENOUS

## 2012-03-03 MED ORDER — PANTOPRAZOLE SODIUM 40 MG PO TBEC
40.0000 mg | DELAYED_RELEASE_TABLET | Freq: Every day | ORAL | Status: DC
  Filled 2012-03-03: qty 1

## 2012-03-03 MED ORDER — ONDANSETRON HCL 4 MG PO TABS: 4.0000 mg | ORAL_TABLET | Freq: Four times a day (QID) | ORAL | Status: DC | PRN

## 2012-03-03 NOTE — Progress Notes (Signed)
Subjective: Patient seen and examined this morning. Abdominal pain has improved.still has occasional nausea.   Objective:  Vital signs in last 24 hours:  Filed Vitals:   03/02/12 1930 03/02/12 2200 03/03/12 0630 03/03/12 1400  BP:  105/52 103/54 96/38  Pulse:  69 74 77  Temp: 98.4 F (36.9 C) 98.3 F (36.8 C) 97.8 F (36.6 C) 98.8 F (37.1 C)  TempSrc: Oral Oral Oral Oral  Resp:  18 18 18   Height:      Weight:      SpO2:  97% 93% 99%    Intake/Output from previous day:   Intake/Output Summary (Last 24 hours) at 03/03/12 1434 Last data filed at 03/03/12 1315  Gross per 24 hour  Intake   1970 ml  Output      0 ml  Net   1970 ml    Physical Exam:  General: middle aged female in no acute distress. HEENT: no pallor, no icterus, moist oral mucosa, no JVD, no lymphadenopathy Heart: Normal  s1 &s2  Regular rate and rhythm, without murmurs, rubs, gallops. Lungs: Clear to auscultation bilaterally. Abdomen: Soft, tender to deep palpation over epigastric and RUQ , nondistended, positive bowel sounds. Extremities: No clubbing cyanosis or edema with positive pedal pulses. Neuro: Alert, awake, oriented x3, nonfocal.   Lab Results:  Basic Metabolic Panel:    Component Value Date/Time   NA 140 03/02/2012 1351   K 3.6 03/02/2012 1351   CL 107 03/02/2012 1351   CO2 25 03/02/2012 1351   BUN 9 03/02/2012 1351   CREATININE 0.65 03/02/2012 1351   GLUCOSE 86 03/02/2012 1351   CALCIUM 8.5 03/02/2012 1351   CBC:    Component Value Date/Time   WBC 5.6 03/02/2012 1351   HGB 10.4* 03/02/2012 1351   HCT 32.2* 03/02/2012 1351   PLT 236 03/02/2012 1351   MCV 92.8 03/02/2012 1351   NEUTROABS 3.1 03/02/2012 1351   LYMPHSABS 2.0 03/02/2012 1351   MONOABS 0.4 03/02/2012 1351   EOSABS 0.1 03/02/2012 1351   BASOSABS 0.0 03/02/2012 1351    No results found for this or any previous visit (from the past 240 hour(s)).  Studies/Results: US Abdomen Complete  03/02/2012  *RADIOLOGY REPORT*  Clinical Data:  Epigastric  pain with transaminitis.  ABDOMINAL ULTRASOUND COMPLETE  Comparison:  None.  Findings:  Gallbladder:  No gallstones or gallbladder sludge.  Gallbladder wall thickness measures 2.8 to 4.2 mm.  There is a small amount of fluid between the liver and gallbladder on image number 42, similar in appearance to that seen on today's CT examination.  The sonographic Murphy's sign is reported to be negative by the ultrasound technologist.  Common Bile Duct:  Common bile duct measures 5-6 mm.  Liver: No focal mass lesion identified.  Within normal limits in parenchymal echogenicity.  IVC:  Appears normal.  Pancreas:  Although the pancreas is difficult to visualize in its entirety, no focal pancreatic abnormality is identified.  Spleen:  Within normal limits in size and echotexture.  Right kidney:  Normal in size and parenchymal echogenicity.  No evidence of mass or hydronephrosis.  Left kidney:  Normal in size and parenchymal echogenicity.  No evidence of mass or hydronephrosis.  Abdominal Aorta:  No aneurysm identified.  IMPRESSION: 1.  There is a tiny amount of fluid between the gallbladder and the liver, as seen on today's CT. Given the appearance of the periportal edema on today's CT and reported elevated transaminases, this is likely related to an inflammatory  hepatic process, possibly hepatitis. 2.  Gallbladder wall thickness measures upper normal to slightly prominent.  This could be secondary to a primary hepatic inflammatory process.  There are no gallstones or sludge visible, and the sonographic Murphy's sign is negative; therefore, cholecystitis is felt to be unlikely.  Original Report Authenticated By: Britta Mccreedy, M.D.   Ct Abdomen Pelvis W Contrast  03/02/2012  *RADIOLOGY REPORT*  Clinical Data: Abdominal pain and nausea.  CT ABDOMEN AND PELVIS WITH CONTRAST  Technique:  Multidetector CT imaging of the abdomen and pelvis was performed following the standard protocol during bolus administration of intravenous  contrast.  Contrast: OMNIPAQUE IOHEXOL 300 MG/ML  SOLN  Comparison: Abdominal ultrasound 02/28/2012.  Findings: The lung bases are clear.  The liver is abnormal.  There is mild periportal edema, heterogeneous attenuation and possible mild diffuse edema. Findings could be due to inflammatory process such as hepatitis. No focal lesions or intrahepatic biliary dilatation.  Recommend correlation with liver function studies.  The gallbladder appears normal.  Mild rim of edema between the gallbladder and the liver. The common bile duct is normal in caliber.  The pancreas is normal. The spleen is normal in size.  No focal lesions.  The adrenal glands and kidneys are normal.  The stomach, duodenum, small bowel and colon are unremarkable. There is moderate stool throughout the colon.  No mesenteric or retroperitoneal masses or adenopathy.  The aorta is normal in caliber.  The major branch vessels are normal.  The portal and splenic veins are patent.  The uterus and ovaries are normal.  The bladder is normal.  No pelvic mass or lymphadenopathy.  No significant free pelvic fluid collections.  No inguinal mass or hernia.  The bony structures are intact.  IMPRESSION:  1.  Findings suspicious for inflammatory hepatic process such as hepatitis.  Recommend correlation with liver function studies. 2.  No findings for biliary obstruction. 3.  No other significant abdominal/pelvic findings.  Original Report Authenticated By: P. Loralie Champagne, M.D.    Medications: Scheduled Meds:   . cephALEXin  500 mg Oral TID  . docusate sodium  100 mg Oral BID  . enoxaparin  40 mg Subcutaneous Q24H  . pantoprazole  40 mg Oral Q1200  . DISCONTD: pantoprazole (PROTONIX) IV  40 mg Intravenous Q12H   Continuous Infusions:   . DISCONTD: sodium chloride Stopped (03/03/12 1117)   PRN Meds:.alum & mag hydroxide-simeth, ondansetron, oxyCODONE, polyethylene glycol, sodium phosphate, zolpidem, DISCONTD: ondansetron  Assessment: 56 y/o  female presenting with epigastric pan and nausea for past 3 days with findings of transaminitis.  PLAN:  Acute trasnaminitis related to acute hepatitis likely viral in etiology Hepatitis panel sent . Negative for Hbs ag and HCV . Hepatitis A IgM pending. symptoms likely for hepatitis A LFTs improving  on clears. Will advance diet  CT and USG abdomen suggests of hepatitis  LFTs slowly trending down. Follow ANA, anti smooth muscle ab, TSH, transferrin sat and ferritin levels  Anemia Iron panel non revealing  folate, b12 wnl  can follow as outpt  Diet: advance diet to regular  Full code     LOS: 2 days   Colleen Elliott 03/03/2012, 2:34 PM

## 2012-03-04 DIAGNOSIS — B179 Acute viral hepatitis, unspecified: Secondary | ICD-10-CM | POA: Diagnosis present

## 2012-03-04 LAB — HEPATIC FUNCTION PANEL
Albumin: 3.1 g/dL — ABNORMAL LOW (ref 3.5–5.2)
Total Bilirubin: 0.2 mg/dL — ABNORMAL LOW (ref 0.3–1.2)
Total Protein: 6.1 g/dL (ref 6.0–8.3)

## 2012-03-04 LAB — URINE CULTURE: Culture  Setup Time: 201306040213

## 2012-03-04 LAB — HEPATITIS C ANTIBODY: HCV Ab: NEGATIVE

## 2012-03-04 LAB — FERRITIN: Ferritin: 73 ng/mL (ref 10–291)

## 2012-03-04 NOTE — Discharge Summary (Signed)
Patient ID: Colleen Elliott MRN: [id number] DOB/AGE: [birth year] 56 y.o.  Admit date: 03/01/2012 Discharge date: 03/04/2012  Primary Care Physician:  Rudi Heap, MD, MD  Discharge Diagnoses:     Principal Problem:  *Acute hepatitis ( viral vs toxic)  Active Problems:  Abdominal discomfort, epigastric  Transaminitis   Medication List  As of 03/04/2012 11:03 AM   STOP taking these medications         cephALEXin 500 MG capsule      HYDROcodone-acetaminophen 5-325 MG per tablet            Disposition and Follow-up:  Home with PCP follow up in 1 week. Needs  LFTs checked  Consults:  none  Significant Diagnostic Studies:  US Abdomen Complete  03/02/2012  *RADIOLOGY REPORT*  Clinical Data:  Epigastric pain with transaminitis.  ABDOMINAL ULTRASOUND COMPLETE  Comparison:  None.  Findings:  Gallbladder:  No gallstones or gallbladder sludge.  Gallbladder wall thickness measures 2.8 to 4.2 mm.  There is a small amount of fluid between the liver and gallbladder on image number 42, similar in appearance to that seen on today's CT examination.  The sonographic Murphy's sign is reported to be negative by the ultrasound technologist.  Common Bile Duct:  Common bile duct measures 5-6 mm.  Liver: No focal mass lesion identified.  Within normal limits in parenchymal echogenicity.  IVC:  Appears normal.  Pancreas:  Although the pancreas is difficult to visualize in its entirety, no focal pancreatic abnormality is identified.  Spleen:  Within normal limits in size and echotexture.  Right kidney:  Normal in size and parenchymal echogenicity.  No evidence of mass or hydronephrosis.  Left kidney:  Normal in size and parenchymal echogenicity.  No evidence of mass or hydronephrosis.  Abdominal Aorta:  No aneurysm identified.  IMPRESSION: 1.  There is a tiny amount of fluid between the gallbladder and the liver, as seen on today's CT. Given the appearance of the periportal edema on today's CT and reported elevated  transaminases, this is likely related to an inflammatory hepatic process, possibly hepatitis. 2.  Gallbladder wall thickness measures upper normal to slightly prominent.  This could be secondary to a primary hepatic inflammatory process.  There are no gallstones or sludge visible, and the sonographic Murphy's sign is negative; therefore, cholecystitis is felt to be unlikely.  Original Report Authenticated By: Britta Mccreedy, M.D.   Ct Abdomen Pelvis W Contrast  03/02/2012  *RADIOLOGY REPORT*  Clinical Data: Abdominal pain and nausea.  CT ABDOMEN AND PELVIS WITH CONTRAST  Technique:  Multidetector CT imaging of the abdomen and pelvis was performed following the standard protocol during bolus administration of intravenous contrast.  Contrast: OMNIPAQUE IOHEXOL 300 MG/ML  SOLN  Comparison: Abdominal ultrasound 02/28/2012.  Findings: The lung bases are clear.  The liver is abnormal.  There is mild periportal edema, heterogeneous attenuation and possible mild diffuse edema. Findings could be due to inflammatory process such as hepatitis. No focal lesions or intrahepatic biliary dilatation.  Recommend correlation with liver function studies.  The gallbladder appears normal.  Mild rim of edema between the gallbladder and the liver. The common bile duct is normal in caliber.  The pancreas is normal. The spleen is normal in size.  No focal lesions.  The adrenal glands and kidneys are normal.  The stomach, duodenum, small bowel and colon are unremarkable. There is moderate stool throughout the colon.  No mesenteric or retroperitoneal masses or adenopathy.  The aorta is normal in caliber.  The major branch vessels are normal.  The portal and splenic veins are patent.  The uterus and ovaries are normal.  The bladder is normal.  No pelvic mass or lymphadenopathy.  No significant free pelvic fluid collections.  No inguinal mass or hernia.  The bony structures are intact.  IMPRESSION:  1.  Findings suspicious for  inflammatory hepatic process such as hepatitis.  Recommend correlation with liver function studies. 2.  No findings for biliary obstruction. 3.  No other significant abdominal/pelvic findings.  Original Report Authenticated By: P. Loralie Champagne, M.D.    Brief H and P: For complete details please refer to admission H and P, but in brief 56 yo female with no signoficat PMHx medical history who presents to the [facility] ED with c/o abdominal pain and nausea. She was here at ED on 02/28/12 for basically the same sx and was found to have a UTI. She was d/c'd home on Keflex and Hydrocodone/tylenol.  However, on 03/01/12, her sx returned and she  also vomited. She had  abd pain rated as a 10/10. She has nausea without vomiting since arrival. Her pain is dull/gnawing located mostly in the epigastric area. She denies frequent heartburn. She has had decreased appetite and intake, basically, only eating crackers and drinking Sprite x 3 days. Last BM 2 days ago. No PMHx of GERD, ulcer, or surgery other than a tubal ligation many years ago. She no longer menstruates.    Physical Exam on Discharge:  Filed Vitals:   03/03/12 1720 03/03/12 2200 03/04/12 0630 03/04/12 0841  BP: 104/65 108/66 107/48 108/60  Pulse: 83 76 82 84  Temp: 99.2 F (37.3 C) 98.5 F (36.9 C) 97.9 F (36.6 C)   TempSrc: Oral Oral Oral   Resp: 18 20 20    Height:      Weight:      SpO2: 98% 98% 99%      Intake/Output Summary (Last 24 hours) at 03/04/12 1103 Last data filed at 03/03/12 1315  Gross per 24 hour  Intake    420 ml  Output      0 ml  Net    420 ml    General: Alert, awake, oriented x3, in no acute distress. HEENT: No bruits, no goiter. Heart: Regular rate and rhythm, without murmurs, rubs, gallops. Lungs: Clear to auscultation bilaterally. Abdomen: Soft, nontender, nondistended, positive bowel sounds. Extremities: No clubbing cyanosis or edema with positive pedal pulses. Neuro: Grossly intact, nonfocal.  CBC:      Component Value Date/Time   WBC 5.6 03/02/2012 1351   HGB 10.4* 03/02/2012 1351   HCT 32.2* 03/02/2012 1351   PLT 236 03/02/2012 1351   MCV 92.8 03/02/2012 1351   NEUTROABS 3.1 03/02/2012 1351   LYMPHSABS 2.0 03/02/2012 1351   MONOABS 0.4 03/02/2012 1351   EOSABS 0.1 03/02/2012 1351   BASOSABS 0.0 03/02/2012 1351    Basic Metabolic Panel:    Component Value Date/Time   NA 140 03/02/2012 1351   K 3.6 03/02/2012 1351   CL 107 03/02/2012 1351   CO2 25 03/02/2012 1351   BUN 9 03/02/2012 1351   CREATININE 0.65 03/02/2012 1351   GLUCOSE 86 03/02/2012 1351   CALCIUM 8.5 03/02/2012 1351    Hospital Course:   Acute trasnaminitis  -Presented with transamnitis ( AST 260, ALT 126 and ALKP of 128)  -related to acute hepatitis likely viral vs toxic hepatitis -CT and USG abdomen suggestive of hepatitis . GB and CBD normal, no stones. No ascites  noted. -No clear cause identified. Hepatitis panel negative. No hx of IVDU or high risk sexual behavior. No recent travel. No weight loss or bowel symptoms. Not on any hepatotoxic meds or etoh use, -LFTs now  improving ( ALT 106, AST and ALkP wnl) -Tolerating advanced diet and pain now subsided. Follow ANA, anti smooth muscle ab as outpatient.   patient now stable and can be discharged with outpt follow up of remaining labs for autoimmune hepatitis. If symptoms recur or further trasnamitis noted, GI referral and  USG of liver with doppler studies can be done to r/o budd chiari, which seems unlikely .  Anemia  Appears to be chronic  Iron panel non revealing  folate, b12 wnl  informs having normal colonoscopy 1 year back  can follow as outpt     Time spent on Discharge: 45 minutes  Signed: Brentlee Sciara 03/04/2012, 11:03 AM

## 2012-03-04 NOTE — Progress Notes (Signed)
Gave pt discharge instructions and explained them to her. Pt verbalized understanding of instructions given. Pt left via wheelchair in no acute distress.

## 2012-03-06 LAB — ANA: Anti Nuclear Antibody(ANA): NEGATIVE

## 2012-03-06 LAB — FOLATE RBC: RBC Folate: 534 ng/mL (ref >=366)

## 2012-03-07 LAB — ANTI-SMOOTH MUSCLE ANTIBODY, IGG: F-Actin IgG: 8 U (ref <20)

## 2012-03-07 NOTE — ED Provider Notes (Signed)
History     CSN: [id number]  Arrival date & time 03/01/12  2146   First MD Initiated Contact with Patient 03/01/12 2312      Chief Complaint  Patient presents with  . Abdominal Pain  . Nausea    (Consider location/radiation/quality/duration/timing/severity/associated sxs/prior treatment) Patient is a 56 y.o. female presenting with abdominal pain.  Abdominal Pain The primary symptoms of the illness include abdominal pain, nausea and vomiting. The primary symptoms of the illness do not include fever, shortness of breath or dysuria.  Symptoms associated with the illness do not include chills or back pain.   Per PT, persistent ABD pain x last few days, associated with N/V, no F/C. No diarhea. No travel or ABx, denies IVDA. Denies h/o blood ransfusions, no known sick contacts. HAd [place] here for the same and told it was not her GB. Taking medications at home without relief. Symptoms constant and worsening. Location epigastric not radiating, dull in quality Past Medical History  Diagnosis Date  . No pertinent past medical history   . Anemia     Past Surgical History  Procedure Date  . Tubal ligation     No family history on file.  History  Substance Use Topics  . Smoking status: Never Smoker   . Smokeless tobacco: Not on file  . Alcohol Use: Yes     occasional alcohol, one drink 1-2 times a month    OB History    Grav Para Term Preterm Abortions TAB SAB Ect Mult Living                  Review of Systems  Constitutional: Negative for fever and chills.  HENT: Negative for neck pain and neck stiffness.   Eyes: Negative for pain.  Respiratory: Negative for shortness of breath.   Cardiovascular: Negative for chest pain.  Gastrointestinal: Positive for nausea, vomiting and abdominal pain.  Genitourinary: Negative for dysuria.  Musculoskeletal: Negative for back pain.  Skin: Negative for rash.  Neurological: Negative for headaches.  All other systems reviewed and are  negative.    Allergies  Review of patient's allergies indicates no known allergies.  Home Medications  No current outpatient prescriptions on file.  BP 108/60  Pulse 84  Temp(Src) 97.9 F (36.6 C) (Oral)  Resp 20  Ht 5\' 1"  (1.549 m)  Wt 115 lb 11.9 oz (52.5 kg)  BMI 21.87 kg/m2  SpO2 99%  Physical Exam  Constitutional: She is oriented to person, place, and time. She appears well-developed and well-nourished.  HENT:  Head: Normocephalic and atraumatic.  Eyes: Conjunctivae and EOM are normal. Pupils are equal, round, and reactive to light.  Neck: Trachea normal. Neck supple. No thyromegaly present.  Cardiovascular: Normal rate, regular rhythm, S1 normal, S2 normal and normal pulses.     No systolic murmur is present   No diastolic murmur is present  Pulses:      Radial pulses are 2+ on the right side, and 2+ on the left side.  Pulmonary/Chest: Effort normal and breath sounds normal. She has no wheezes. She has no rhonchi. She has no rales. She exhibits no tenderness.  Abdominal: Soft. Normal appearance and bowel sounds are normal. There is tenderness. There is no rebound, no guarding, no CVA tenderness and negative Murphy's sign.       Epi tenderness  Musculoskeletal:       BLE:s Calves nontender, no cords or erythema, negative Homans sign  Neurological: She is alert and oriented to person, place,  and time. She has normal strength. No cranial nerve deficit or sensory deficit. GCS eye subscore is 4. GCS verbal subscore is 5. GCS motor subscore is 6.  Skin: Skin is warm and dry. No rash noted. She is not diaphoretic.  Psychiatric: Her speech is normal.       Cooperative and appropriate    ED Course  Procedures (including critical care time)  Labs Reviewed  CBC - Abnormal; Notable for the following:    RBC 3.81 (*)    Hemoglobin 11.4 (*)    HCT 35.4 (*)    All other components within normal limits  COMPREHENSIVE METABOLIC PANEL - Abnormal; Notable for the following:     Glucose, Bld 118 (*)    AST 260 (*)    ALT 126 (*)    Alkaline Phosphatase 128 (*)    All other components within normal limits  POCT I-STAT, CHEM 8 - Abnormal; Notable for the following:    Glucose, Bld 111 (*)    All other components within normal limits  URINALYSIS, ROUTINE W REFLEX MICROSCOPIC - Abnormal; Notable for the following:    Hgb urine dipstick MODERATE (*)    Leukocytes, UA SMALL (*)    All other components within normal limits  COMPREHENSIVE METABOLIC PANEL - Abnormal; Notable for the following:    Albumin 3.2 (*)    AST 190 (*)    ALT 207 (*)    Alkaline Phosphatase 125 (*)    All other components within normal limits  CBC - Abnormal; Notable for the following:    RBC 3.47 (*)    Hemoglobin 10.4 (*)    HCT 32.2 (*)    All other components within normal limits  SEDIMENTATION RATE - Abnormal; Notable for the following:    Sed Rate 55 (*)    All other components within normal limits  SALICYLATE LEVEL - Abnormal; Notable for the following:    Salicylate Lvl <2.0 (*)    All other components within normal limits  IRON AND TIBC - Abnormal; Notable for the following:    Iron 159 (*)    Saturation Ratios 57 (*)    UIBC 119 (*)    All other components within normal limits  HEPATIC FUNCTION PANEL - Abnormal; Notable for the following:    Total Protein 5.6 (*)    Albumin 2.9 (*)    AST 93 (*)    ALT 151 (*)    All other components within normal limits  HEPATIC FUNCTION PANEL - Abnormal; Notable for the following:    Albumin 3.1 (*)    ALT 106 (*)    Total Bilirubin 0.2 (*)    All other components within normal limits  LIPASE, BLOOD  HEPATITIS PANEL, ACUTE  ACETAMINOPHEN LEVEL  URINE MICROSCOPIC-ADD ON  DIFFERENTIAL  ANA  VITAMIN B12  FOLATE RBC  HEPATITIS B SURFACE ANTIGEN  HEPATITIS C ANTIBODY  HEPATITIS A ANTIBODY, IGM  TSH  FERRITIN  TRANSFERRIN  ANTI-SMOOTH MUSCLE ANTIBODY, IGG   No results found.   1. Vomiting   2. Abdominal pain    IVFs,  zofran, labs and CT as above, persistent vomiting, MED consult for admit   MDM   Old records reviewed. Nursing notes reviewed. MED c/s for admit, d/w [facility] hospital[facility] who agrees to admission        Sunnie Nielsen, MD 03/07/12 0981

## 2012-03-08 NOTE — Progress Notes (Deleted)
This serves as History and Physical even though written on a "progress note"  CC: abdominal pain and nausea  Subjective: Mrs. Deadwyler is a 56 yo WF with a benign past medical history who presents to the [facility] ED with c/o abdominal pain and nausea. She was here at ED on 02/28/12 for basically the same sx and was found to have a UTI. She was d/c'd home on Keflex and Hydrocodone/acet. She reports feeling about the same until Tues 02/29/12 when she had no pain and no nausea. However, on 03/01/12, her sx returned and she has also vomited. Therefore, she returned to the ED. She continues her Keflex.  Tonight she reports a return of the above sx with abd pain rated as a 10/10. She has nausea without vomiting since arrival. Her pain is dull/gnawing located mostly in the epigastric area. She denies frequent heartburn. She has had decreased appetite and intake, basically, only eating crackers and drinking Sprite x 3 days. Last BM 2 days ago. No PMHx of GERD, ulcer, or surgery other than a tubal ligation many years ago. She no longer menstruates.   She has no risk factors for Hepatitis (? Shown on CT) and LFTs elevated here. She denies hx of IVDA, sexual promiscuity, intake of questionable foods, travel out of the [place]. She denies use of Tylenol or Ibuprofen, except on a rare occasion. No use of Benadryl.   Therefore, because of the patient's sx and tests results, [facility] are asked to admit patient for further tx and evaluation.   PMHx: anemia (usually around 10 Hgb), multiple UTIs PSHx: tubal ligation PFHx: CAD, diabetes SHx: married x 18 years, monogamous x 18 years. Lives in [place], [place]. Grown child and has a grandchild. Still employed. Rare ETOH, never smoked, denies illicit drugs.   ROS: Gen: Denies f/c. Denies increased fatigue.  HEENT: + Headache since 02/28/12. Denies cold sx, other recent sickness, sorethroat, swollen glands, asymmetrical face, vision changes.  Card: Denies chest pain, SOB, orthopnea,  DOE, syncope, palpitations Resp: Denies SOB, cough, wheezing, sputum production Abd: + pain mostly epigastric, nausea, some vomiting. Denies BRBPR, constipation. MSK: Denies joint pain, erythema, heat or swelling to joints.  Neuro: Denies numbness, tingling, weakness, LOC, speech changes, gait disturbance. Psych: Denies depression or anxiety.    Objective:  Vital signs in last 24 hours:   Weight change:  Last BM Date: 02/28/12 PE:  General: VSS. WDWN WF in NAD. Alert and oriented. Appears stated age. Lying on stretcher. Appears well but fatigued.   HEENT: [place], AT. Neck supple without anterior cervical lymphadenopathy. No icterus noted. Card: S1S2 without MRG. RRR. No carotid bruits noted.  Resp: Normal resp effort. Lungs CTA.  Abd: BS + x 4 quads, slightly hypoactive in lower quads. Soft, ND. Tender with palpation to the RLQ and epigastric area. No rebound, guarding. Murphy's and McBurney's negative. MSK: MOE x 4. Joints without swelling or redness.   Neuro: PERRL, CN 2-12 grossly intact. No noted focal neuro deficits.  Psych: Affect light, pleasant and cooperative  Lab Results: No results found for this basename: WBC:2,HGB:2,HCT:2,PLT:2 in the last 72 hours BMET No results found for this basename: NA:2,K:2,CL:2,CO2:2,GLUCOSE:2,BUN:2,CREATININE:2,CALCIUM:2 in the last 72 hours  Studies/Results: No results found.  Medications: reviewed  Assessment/Plan: 1. Abdominal pain, mostly epigastric. Likely Hepatitis ?A. Pt doesn't drink ETOH and has not risk factors for Hep B or C. [place] and CT negative for gallbladder issues and exam is not consistent with this. Pancreas normal on CT and lipase normal.  For now, will make her NPO except sips and chips in case a procedure is needed tomorrow. Hydrate with IVF. Continue pain and nausea meds. Protonix. May need to Consult GI. Check ANA, ESR to r/o autoimmune Hepatitis.  2. Transaminitis-LFTs are high. ? Etiology. Denies risk factors for Hepatitis,  but CT favors this. Will check tylenol level. Hep panel is pending. Recheck CMP later today. Follow LFTs closely. Avoid Acetaminophen products and NSAIDs.  3. Hx recent UTI-UA pending today, urine culture from previous ED visit is pending. Continue Keflex for now. 4. Prophylaxis-Lovenox.     Maren Reamer 03/08/2012, 6:41 AM Case discussed with Dr. Toniann Fail who agrees with plan. Attending, Dr. Gonzella Lex, will discuss with him also and assign cosign to him.

## 2012-03-14 NOTE — H&P (Signed)
This serves as History and Physical even though written on a "progress note"  CC: abdominal pain and nausea  Subjective:  Colleen Elliott is a 56 yo WF with a benign past medical history who presents to the [facility] ED with c/o abdominal pain and nausea. She was here at ED on 02/28/12 for basically the same sx and was found to have a UTI. She was d/c'd home on Keflex and Hydrocodone/acet. She reports feeling about the same until Tues 02/29/12 when she had no pain and no nausea. However, on 03/01/12, her sx returned and she has also vomited. Therefore, she returned to the ED. She continues her Keflex.  Tonight she reports a return of the above sx with abd pain rated as a 10/10. She has nausea without vomiting since arrival. Her pain is dull/gnawing located mostly in the epigastric area. She denies frequent heartburn. She has had decreased appetite and intake, basically, only eating crackers and drinking Sprite x 3 days. Last BM 2 days ago. No PMHx of GERD, ulcer, or surgery other than a tubal ligation many years ago. She no longer menstruates.  She has no risk factors for Hepatitis (? Shown on CT) and LFTs elevated here. She denies hx of IVDA, sexual promiscuity, intake of questionable foods, travel out of the [place]. She denies use of Tylenol or Ibuprofen, except on a rare occasion. No use of Benadryl.  Therefore, because of the patient's sx and tests results, [facility] are asked to admit patient for further tx and evaluation.  PMHx: anemia (usually around 10 Hgb), multiple UTIs  PSHx: tubal ligation  PFHx: CAD, diabetes  SHx: married x 18 years, monogamous x 18 years. Lives in [place], [place]. Grown child and has a grandchild. Still employed. Rare ETOH, never smoked, denies illicit drugs.  ROS:  Gen: Denies f/c. Denies increased fatigue.  HEENT: + Headache since 02/28/12. Denies cold sx, other recent sickness, sorethroat, swollen glands, asymmetrical face, vision changes.  Card: Denies chest pain, SOB, orthopnea, DOE,  syncope, palpitations  Resp: Denies SOB, cough, wheezing, sputum production  Abd: + pain mostly epigastric, nausea, some vomiting. Denies BRBPR, constipation.  MSK: Denies joint pain, erythema, heat or swelling to joints.  Neuro: Denies numbness, tingling, weakness, LOC, speech changes, gait disturbance.  Psych: Denies depression or anxiety.  Objective:  Vital signs in last 24 hours:   Weight change:  Last BM Date: 02/28/12  PE:  General: VSS. WDWN WF in NAD. Alert and oriented. Appears stated age. Lying on stretcher. Appears well but fatigued.  HEENT: [place], AT. Neck supple without anterior cervical lymphadenopathy. No icterus noted.  Card: S1S2 without MRG. RRR. No carotid bruits noted.  Resp: Normal resp effort. Lungs CTA.  Abd: BS + x 4 quads, slightly hypoactive in lower quads. Soft, ND. Tender with palpation to the RLQ and epigastric area. No rebound, guarding. Murphy's and McBurney's negative.  MSK: MOE x 4. Joints without swelling or redness.  Neuro: PERRL, CN 2-12 grossly intact. No noted focal neuro deficits.  Psych: Affect light, pleasant and cooperative  Lab Results:  No results found for this basename: WBC:2,HGB:2,HCT:2,PLT:2 in the last 72 hours  BMET  No results found for this basename: NA:2,K:2,CL:2,CO2:2,GLUCOSE:2,BUN:2,CREATININE:2,CALCIUM:2 in the last 72 hours  Studies/Results:  No results found.  Medications: reviewed  Assessment/Plan:  1. Abdominal pain, mostly epigastric. Likely Hepatitis ?A. Pt doesn't drink ETOH and has not risk factors for Hep B or C. [place] and CT negative for gallbladder issues and exam is not consistent  with this. Pancreas normal on CT and lipase normal. For now, will make her NPO except sips and chips in case a procedure is needed tomorrow. Hydrate with IVF. Continue pain and nausea meds. Protonix. May need to Consult GI. Check ANA, ESR to r/o autoimmune Hepatitis.  2. Transaminitis-LFTs are high. ? Etiology. Denies risk factors for Hepatitis, but  CT favors this. Will check tylenol level. Hep panel is pending. Recheck CMP later today. Follow LFTs closely. Avoid Acetaminophen products and NSAIDs.  3. Hx recent UTI-UA pending today, urine culture from previous ED visit is pending. Continue Keflex for now.  4. Prophylaxis-Lovenox.  Colleen Elliott  03/08/2012, 6:45 AM  Case discussed with Dr. Toniann Fail who agrees with plan. Attending, Dr. Gonzella Lex, will discuss with him also and assign cosign to him.

## 2012-03-28 NOTE — H&P (Signed)
i have seen and examined the patient and reviewed NPs admission H&P and assessment and plan as outlined above on the day of admission

## 2013-01-02 ENCOUNTER — Telehealth: Payer: Self-pay | Admitting: Family Medicine

## 2013-01-02 NOTE — Telephone Encounter (Signed)
NO APPTS AVAILABLE PT WILL GO TO URGENT CARE

## 2013-04-30 ENCOUNTER — Other Ambulatory Visit: Payer: Self-pay

## 2013-04-30 DIAGNOSIS — Z1231 Encounter for screening mammogram for malignant neoplasm of breast: Secondary | ICD-10-CM

## 2013-05-21 ENCOUNTER — Encounter: Payer: Self-pay | Admitting: Obstetrics & Gynecology

## 2013-05-21 ENCOUNTER — Ambulatory Visit
Admission: RE | Admit: 2013-05-21 | Discharge: 2013-05-21 | Disposition: A | Discharge status: Home / Self Care | Payer: 59 | Source: Ambulatory Visit

## 2013-05-21 DIAGNOSIS — Z1231 Encounter for screening mammogram for malignant neoplasm of breast: Secondary | ICD-10-CM

## 2013-05-22 ENCOUNTER — Ambulatory Visit: Payer: Self-pay | Admitting: Obstetrics & Gynecology

## 2013-05-31 ENCOUNTER — Encounter: Payer: Self-pay | Admitting: Obstetrics & Gynecology

## 2013-06-01 ENCOUNTER — Encounter: Payer: Self-pay | Admitting: Obstetrics & Gynecology

## 2013-06-01 ENCOUNTER — Ambulatory Visit (INDEPENDENT_AMBULATORY_CARE_PROVIDER_SITE_OTHER): Payer: 59 | Admitting: Obstetrics & Gynecology

## 2013-06-01 VITALS — BP 116/68 | HR 64 | Resp 16 | Ht 60.75 in | Wt 119.0 lb

## 2013-06-01 DIAGNOSIS — Z0000 Encounter for general adult medical examination without abnormal findings: Secondary | ICD-10-CM

## 2013-06-01 DIAGNOSIS — Z01419 Encounter for gynecological examination (general) (routine) without abnormal findings: Secondary | ICD-10-CM

## 2013-06-01 DIAGNOSIS — Z124 Encounter for screening for malignant neoplasm of cervix: Secondary | ICD-10-CM

## 2013-06-01 LAB — POCT URINALYSIS DIPSTICK
Ketones, UA: NEGATIVE
Nitrite, UA: NEGATIVE
Protein, UA: NEGATIVE

## 2013-06-01 LAB — HEMOGLOBIN, FINGERSTICK: Hemoglobin, fingerstick: 12.6 g/dL (ref 12.0–16.0)

## 2013-06-01 MED ORDER — NITROFURANTOIN MONOHYD MACRO 100 MG PO CAPS: 100.0000 mg | ORAL_CAPSULE | Freq: Two times a day (BID) | ORAL | Status: DC

## 2013-06-01 NOTE — Patient Instructions (Signed)

## 2013-06-01 NOTE — Addendum Note (Signed)
Addended by: Jerene Bears on: 06/01/2013 12:32 PM   Modules accepted: Orders

## 2013-06-01 NOTE — Progress Notes (Signed)
57 y.o. G2P1 MarriedCaucasianF here for annual exam.  Has had no bladder or kidney issues lately.  No vaginal bleeding.  Was in ER last year in the summer.  Had abdominal pain and elevated liver enzymes.  These were followed back to normal by Dr. Christell Constant.    Patient's last menstrual period was 01/25/2005.          Sexually active: yes  The current method of family planning is tubal ligation.    Exercising: no  not regularly Smoker:  no  Health Maintenance: Pap:  05/05/11 WNL History of abnormal Pap:  no MMG:  05/21/13 normal Colonoscopy:  9/11 repeat in 10 years, Dr. Leone Payor BMD:   12/12, -0.7/-2.6 TDaP:  Had at work about 5 years ago, will check to be sure Screening Labs: plan next year for fasting labs, Hb today: 12.6, Urine today: WBC-trace, RBC-+1   reports that she has never smoked. She has never used smokeless tobacco. She reports that she drinks about 0.5 ounces of alcohol per week. She reports that she does not use illicit drugs.  Past Medical History  Diagnosis Date  . No pertinent past medical history   . Anemia   . Hematuria     negative work up  . Kidney stone   . Osteopenia   . Diverticulosis     Past Surgical History  Procedure Laterality Date  . Tubal ligation      Current Outpatient Prescriptions  Medication Sig Dispense Refill  . ibuprofen (ADVIL,MOTRIN) 200 MG tablet Take 200 mg by mouth every 6 (six) hours as needed for pain.      . Multiple Vitamins-Minerals (MULTIVITAMIN PO) Take by mouth. With calcium, vitamin d, and magnesium       No current facility-administered medications for this visit.    Family History  Problem Relation Age of Onset  . Diabetes Paternal Aunt     ROS:  Pertinent items are noted in HPI.  Otherwise, a comprehensive ROS was negative.  Exam:   BP 116/68  Pulse 64  Resp 16  Ht 5' 0.75" (1.543 m)  Wt 119 lb (53.978 kg)  BMI 22.67 kg/m2  LMP 01/25/2005     Height: 5' 0.75" (154.3 cm)  Ht Readings from Last 3 Encounters:   06/01/13 5' 0.75" (1.543 m)  03/02/12 5\' 1"  (1.549 m)  02/28/12 5\' 1"  (1.549 m)    General appearance: alert, cooperative and appears stated age Head: Normocephalic, without obvious abnormality, atraumatic Neck: no adenopathy, supple, symmetrical, trachea midline and thyroid normal to inspection and palpation Lungs: clear to auscultation bilaterally Breasts: normal appearance, no masses or tenderness Heart: regular rate and rhythm Abdomen: soft, non-tender; bowel sounds normal; no masses,  no organomegaly Extremities: extremities normal, atraumatic, no cyanosis or edema Skin: Skin color, texture, turgor normal. No rashes or lesions Lymph nodes: Cervical, supraclavicular, and axillary nodes normal. No abnormal inguinal nodes palpated Neurologic: Grossly normal   Pelvic: External genitalia:  no lesions              Urethra:  normal appearing urethra with no masses, tenderness or lesions              Bartholins and Skenes: normal                 Vagina: normal appearing vagina with normal color and discharge, no lesions              Cervix: no lesions  Pap taken: yes Bimanual Exam:  Uterus:  normal size, contour, position, consistency, mobility, non-tender              Adnexa: no mass, fullness, tenderness               Rectovaginal: Confirms               Anus:  normal sphincter tone, no lesions  A:  Well Woman with normal exam PMP, No HRT H/O renal stones and recurrent UTIs  P:   Mammogram yearly.  D/W pt 3D mmg due to grade 3 dense breasts pap smear with neg HR HPV today Fasting labs next year Pt will check on last tetanus shot.  Knows to call if needs one and we will be happy to give it to her. return annually or prn  An After Visit Summary was printed and given to the patient.

## 2014-06-17 ENCOUNTER — Encounter: Payer: Self-pay | Admitting: Obstetrics & Gynecology

## 2014-06-17 ENCOUNTER — Ambulatory Visit (INDEPENDENT_AMBULATORY_CARE_PROVIDER_SITE_OTHER): Payer: PRIVATE HEALTH INSURANCE | Admitting: Obstetrics & Gynecology

## 2014-06-17 VITALS — BP 112/62 | HR 64 | Resp 16 | Ht 60.25 in | Wt 118.2 lb

## 2014-06-17 DIAGNOSIS — Z01419 Encounter for gynecological examination (general) (routine) without abnormal findings: Secondary | ICD-10-CM

## 2014-06-17 DIAGNOSIS — M810 Age-related osteoporosis without current pathological fracture: Secondary | ICD-10-CM

## 2014-06-17 DIAGNOSIS — N3000 Acute cystitis without hematuria: Secondary | ICD-10-CM

## 2014-06-17 DIAGNOSIS — Z0000 Encounter for general adult medical examination without abnormal findings: Secondary | ICD-10-CM

## 2014-06-17 LAB — POCT URINALYSIS DIPSTICK
BILIRUBIN UA: NEGATIVE
GLUCOSE UA: NEGATIVE
Ketones, UA: NEGATIVE
NITRITE UA: NEGATIVE
Protein, UA: NEGATIVE
Urobilinogen, UA: NEGATIVE
pH, UA: 5

## 2014-06-17 MED ORDER — SULFAMETHOXAZOLE-TMP DS 800-160 MG PO TABS: 1.0000 | ORAL_TABLET | Freq: Two times a day (BID) | ORAL | Status: DC

## 2014-06-17 NOTE — Progress Notes (Signed)
58 y.o. G2P1 MarriedCaucasianF here for annual exam.  Thinks she might have another UTI.  Increased frequency and urgency.  Hasn't seen any blood.  Having a little back ache as well.     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  Not working with Dr. Hazle Quant anymore.    Followed with Dr. Christell Constant after ER admission and elevated liver enzymes.  Everything normalized.  Patient's last menstrual period was 01/25/2005.          Sexually active: Yes.    The current method of family planning is tubal ligation.    Exercising: Yes.    walking Smoker:  no  Health Maintenance: Pap:  06/01/13 WNL/negative HR HPV History of abnormal Pap:  no MMG:  05/21/13-normal Colonoscopy:  2011-repeat in 10 years BMD:   12/12 TDaP:  5-6 years ago Screening Labs: with PCP, Hb today: 11.7, Urine today: WBC-2+, RBC-2+   reports that she has never smoked. She has never used smokeless tobacco. She reports that she drinks about .5 - 1 ounces of alcohol per week. She reports that she does not use illicit drugs.  Past Medical History  Diagnosis Date  . No pertinent past medical history   . Anemia   . Hematuria     negative work up  . Kidney stone   . Osteopenia   . Diverticulosis     Past Surgical History  Procedure Laterality Date  . Tubal ligation      Current Outpatient Prescriptions  Medication Sig Dispense Refill  . ibuprofen (ADVIL,MOTRIN) 200 MG tablet Take 200 mg by mouth every 6 (six) hours as needed for pain.      . Multiple Vitamins-Minerals (MULTIVITAMIN PO) Take by mouth. With calcium, vitamin d, and magnesium      . nitrofurantoin, macrocrystal-monohydrate, (MACROBID) 100 MG capsule Take 1 capsule (100 mg total) by mouth 2 (two) times daily.  14 capsule  0   No current facility-administered medications for this visit.    Family History  Problem Relation Age of Onset  . Diabetes Paternal Aunt     ROS:  Pertinent items are noted in HPI.  Otherwise, a comprehensive ROS was  negative.  Exam:   BP 112/62  Pulse 64  Resp 16  Ht 5' 0.25" (1.53 m)  Wt 118 lb 3.2 oz (53.615 kg)  BMI 22.90 kg/m2  LMP 01/25/2005  Weight change: -1#  Height: 5' 0.25" (153 cm)  Ht Readings from Last 3 Encounters:  06/17/14 5' 0.25" (1.53 m)  06/01/13 5' 0.75" (1.543 m)  03/02/12  (1.549 m)    General appearance: alert, cooperative and appears stated age Head: Normocephalic, without obvious abnormality, atraumatic Neck: no adenopathy, supple, symmetrical, trachea midline and thyroid normal to inspection and palpation Lungs: clear to auscultation bilaterally Breasts: normal appearance, no masses or tenderness Heart: regular rate and rhythm Abdomen: soft, non-tender; bowel sounds normal; no masses,  no organomegaly Extremities: extremities normal, atraumatic, no cyanosis or edema Skin: Skin color, texture, turgor normal. No rashes or lesions Lymph nodes: Cervical, supraclavicular, and axillary nodes normal. No abnormal inguinal nodes palpated Neurologic: Grossly normal   Pelvic: External genitalia:  no lesions              Urethra:  normal appearing urethra with no masses, tenderness or lesions              Bartholins and Skenes: normal  Vagina: normal appearing vagina with normal color and discharge, no lesions              Cervix: no lesions              Pap taken: No. Bimanual Exam:  Uterus:  normal size, contour, position, consistency, mobility, non-tender              Adnexa: normal adnexa and no mass, fullness, tenderness               Rectovaginal: Confirms               Anus:  normal sphincter tone, no lesions  A:  Well Woman with normal exam  PMP, No HRT  H/O renal stones and recurrent UTIs   P: Mammogram yearly. D/W pt 3D mmg due to grade 3 dense breasts  pap smear with neg HR HPV 2014.  No pap today. Labs with PCP Bactrim DS bid x 5 days.  Urine micro and urine culture. return annually or prn  An After Visit Summary was printed and  given to the patient.

## 2014-06-18 LAB — URINALYSIS W MICROSCOPIC + REFLEX CULTURE
Bacteria, UA: NONE SEEN
Bilirubin Urine: NEGATIVE
Casts: NONE SEEN
Crystals: NONE SEEN
Glucose, UA: NEGATIVE mg/dL
Ketones, ur: NEGATIVE mg/dL
NITRITE: NEGATIVE
PROTEIN: NEGATIVE mg/dL
SQUAMOUS EPITHELIAL / LPF: NONE SEEN
Specific Gravity, Urine: 1.025 (ref 1.005–1.030)
UROBILINOGEN UA: 0.2 mg/dL (ref 0.0–1.0)
pH: 6 (ref 5.0–8.0)

## 2014-06-18 LAB — HEMOGLOBIN, FINGERSTICK: HEMOGLOBIN, FINGERSTICK: 11.7 g/dL — AB (ref 12.0–16.0)

## 2014-06-19 NOTE — Addendum Note (Signed)
Addended by: Jerene Bears on: 06/19/2014 01:59 PM   Modules accepted: Orders

## 2014-07-01 ENCOUNTER — Ambulatory Visit (INDEPENDENT_AMBULATORY_CARE_PROVIDER_SITE_OTHER): Payer: PRIVATE HEALTH INSURANCE | Admitting: *Deleted

## 2014-07-01 VITALS — BP 90/60 | HR 80 | Temp 98.9°F | Resp 16 | Wt 120.0 lb

## 2014-07-01 DIAGNOSIS — N3000 Acute cystitis without hematuria: Secondary | ICD-10-CM

## 2014-07-01 NOTE — Progress Notes (Signed)
Patient in today for TOC. Patient states she completed Abx and denies any recurrent sx.  - Advised pt to call us if any Sx arise or f she has any questions. We will be calling her by the end of the week with TOC results. - Pt agreed.

## 2014-07-03 LAB — URINE CULTURE
Colony Count: NO GROWTH
ORGANISM ID, BACTERIA: NO GROWTH

## 2014-07-29 ENCOUNTER — Encounter: Payer: Self-pay | Admitting: Obstetrics & Gynecology

## 2015-02-11 ENCOUNTER — Telehealth: Payer: Self-pay | Admitting: Obstetrics & Gynecology

## 2015-02-11 MED ORDER — SULFAMETHOXAZOLE-TRIMETHOPRIM 800-160 MG PO TABS: 1.0000 | ORAL_TABLET | Freq: Two times a day (BID) | ORAL | Status: DC

## 2015-02-11 NOTE — Telephone Encounter (Signed)
She does have these quite a bit, at least one a year, and I have seen her several times for this.  Ok to call in Bactrim DS bid x 5 days.  Thank you for discussing our policy with her.

## 2015-02-11 NOTE — Telephone Encounter (Signed)
Pt states she has an UTI but is unable to leave employment for an appointment. Pt is requesting a medication to be called in. Pt is agreeable to a return call from nurse.

## 2015-02-11 NOTE — Telephone Encounter (Signed)
Spoke with patient. Advised patient of message as seen below from Dr.Miller. Patient is agreeable and verbalizes understanding. Rx for Bactrim DS bid x 5 days sent to [facility] on file. If symptoms persist or worsen will call for OV or seek care at local urgent care.  Routing to provider for final review. Patient agreeable to disposition. Will close encounter.

## 2015-02-11 NOTE — Telephone Encounter (Signed)
Spoke with patient. Patient has been experiencing increased urinary frequency and burning with urination since Friday 5/13. Is having slight lower back pain and chills with using the restroom. Denies fever. 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 "I can't get in to a doctor here because they are all booked. I have these quite often and Dr.Miller know about it." Requesting medication be sent in for treatment. Advised per protocol we do not prescribe medications over the phone as we want to make sure we are properly treating. Patient requests I speak with patient "She knows that I have these all the time." Advised will speak with Dr.Miller ad return call with further recommendations. Patient is agreeable. Aware may need to seek care at local urgent care for treatment.

## 2015-05-09 ENCOUNTER — Telehealth: Payer: Self-pay | Admitting: Family Medicine

## 2015-05-09 NOTE — Telephone Encounter (Signed)
Appointment given for Monday with Dettinger

## 2015-05-12 ENCOUNTER — Encounter: Payer: Self-pay | Admitting: Family Medicine

## 2015-05-12 ENCOUNTER — Ambulatory Visit (INDEPENDENT_AMBULATORY_CARE_PROVIDER_SITE_OTHER): Payer: PRIVATE HEALTH INSURANCE | Admitting: Family Medicine

## 2015-05-12 ENCOUNTER — Encounter (INDEPENDENT_AMBULATORY_CARE_PROVIDER_SITE_OTHER): Payer: Self-pay

## 2015-05-12 VITALS — BP 121/71 | HR 72 | Temp 98.2°F | Ht 60.25 in | Wt 122.2 lb

## 2015-05-12 DIAGNOSIS — Z13220 Encounter for screening for lipoid disorders: Secondary | ICD-10-CM | POA: Diagnosis not present

## 2015-05-12 DIAGNOSIS — Z131 Encounter for screening for diabetes mellitus: Secondary | ICD-10-CM | POA: Diagnosis not present

## 2015-05-12 DIAGNOSIS — J0100 Acute maxillary sinusitis, unspecified: Secondary | ICD-10-CM | POA: Diagnosis not present

## 2015-05-12 MED ORDER — FLUTICASONE PROPIONATE 50 MCG/ACT NA SUSP: 2.0000 | Freq: Every day | NASAL | Status: DC

## 2015-05-12 NOTE — Progress Notes (Signed)
BP 121/71 mmHg  Pulse 72  Temp(Src) 98.2 F (36.8 C) (Oral)  Ht 5' 0.25" (1.53 m)  Wt 122 lb 3.2 oz (55.43 kg)  BMI 23.68 kg/m2  LMP 01/25/2005   Subjective:    Patient ID: Colleen Elliott, female    DOB: [birth year], 59 y.o.   MRN: [id number]  HPI: Colleen Elliott is a 59 y.o. female presenting on 05/12/2015 for Establish Care   HPI Sinus problems Patient presents with two-week course of sinus congestion and pressure worse on the left maxillary sinus and the right. She also has pressure and congestion going up into her eyes along with itching of the eyelids. She has had allergic rhinitis previously but this is worse than when she's had. She went and saw her ophthalmologist and they gave her a topical cream which helped but did not resolve the situation.  Screening labs Patient presents today because she wants to do screening labs and hasn't seen a physician in over 3 years.  Relevant past medical, surgical, family and social history reviewed and updated as indicated. Interim medical history since our last visit reviewed. Allergies and medications reviewed and updated.  Review of Systems  Constitutional: Negative for fever and chills.  HENT: Positive for postnasal drip, rhinorrhea, sinus pressure, sneezing and voice change. Negative for congestion, ear discharge, ear pain and hearing loss.   Eyes: Positive for redness and itching. Negative for pain, discharge and visual disturbance.  Respiratory: Positive for cough. Negative for chest tightness and shortness of breath.   Cardiovascular: Negative for chest pain and leg swelling.  Genitourinary: Negative for dysuria and difficulty urinating.  Musculoskeletal: Negative for back pain and gait problem.  Skin: Negative for rash.  Neurological: Negative for light-headedness and headaches.  Psychiatric/Behavioral: Negative for behavioral problems and agitation.  All other systems reviewed and are negative.   Per HPI unless specifically  indicated above     Medication List       This list is accurate as of: 05/12/15  2:07 PM.  Always use your most recent med list.               fluticasone 50 MCG/ACT nasal spray  Commonly known as:  FLONASE  Place 2 sprays into both nostrils daily.     ibuprofen 200 MG tablet  Commonly known as:  ADVIL,MOTRIN  Take 200 mg by mouth every 6 (six) hours as needed for pain.           Objective:    BP 121/71 mmHg  Pulse 72  Temp(Src) 98.2 F (36.8 C) (Oral)  Ht 5' 0.25" (1.53 m)  Wt 122 lb 3.2 oz (55.43 kg)  BMI 23.68 kg/m2  LMP 01/25/2005  Wt Readings from Last 3 Encounters:  05/12/15 122 lb 3.2 oz (55.43 kg)  07/01/14 120 lb (54.432 kg)  06/17/14 118 lb 3.2 oz (53.615 kg)    Physical Exam  Constitutional: She is oriented to person, place, and time. She appears well-developed and well-nourished. No distress.  HENT:  Right Ear: Hearing, tympanic membrane, external ear and ear canal normal.  Left Ear: Hearing, tympanic membrane, external ear and ear canal normal.  Nose: Mucosal edema and rhinorrhea present. No nasal deformity. Right sinus exhibits maxillary sinus tenderness. Right sinus exhibits no frontal sinus tenderness. Left sinus exhibits maxillary sinus tenderness. Left sinus exhibits no frontal sinus tenderness.  Mouth/Throat: Uvula is midline and mucous membranes are normal. Posterior oropharyngeal edema present. No oropharyngeal exudate, posterior oropharyngeal erythema or tonsillar abscesses.  Eyes: EOM are normal. Pupils are equal, round, and reactive to light. Right eye exhibits no discharge. Left eye exhibits no discharge. Right conjunctiva is injected. Right conjunctiva has no hemorrhage. Left conjunctiva is injected. Left conjunctiva has no hemorrhage. No scleral icterus.  Cardiovascular: Normal rate and regular rhythm.   No murmur heard. Pulmonary/Chest: Effort normal and breath sounds normal. No respiratory distress. She has no wheezes.  Musculoskeletal:  Normal range of motion. She exhibits no edema or tenderness.  Neurological: She is alert and oriented to person, place, and time. Coordination normal.  Skin: Skin is warm and dry. No rash noted. She is not diaphoretic.  Psychiatric: She has a normal mood and affect. Her behavior is normal.  Vitals reviewed.   Results for orders placed or performed in visit on 07/01/14  Urine culture  Result Value Ref Range   Colony Count NO GROWTH    Organism ID, Bacteria NO GROWTH       Assessment & Plan:   Problem List Items Addressed This Visit      Other   Diabetes mellitus screening - Primary   Relevant Orders   BMP8+EGFR   Screening for lipoid disorders   Relevant Orders   Lipid panel    Other Visit Diagnoses    Acute maxillary sinusitis, recurrence not specified        Relevant Medications    fluticasone (FLONASE) 50 MCG/ACT nasal spray        Follow up plan: Return if symptoms worsen or fail to improve.  Caryl Pina, MD [facility] 05/12/2015, 2:07 PM

## 2015-05-13 ENCOUNTER — Other Ambulatory Visit (INDEPENDENT_AMBULATORY_CARE_PROVIDER_SITE_OTHER): Payer: PRIVATE HEALTH INSURANCE

## 2015-05-13 DIAGNOSIS — Z131 Encounter for screening for diabetes mellitus: Secondary | ICD-10-CM | POA: Diagnosis not present

## 2015-05-13 DIAGNOSIS — Z13220 Encounter for screening for lipoid disorders: Secondary | ICD-10-CM

## 2015-05-13 NOTE — Progress Notes (Signed)
Lab only 

## 2015-05-14 LAB — LIPID PANEL
CHOL/HDL RATIO: 3 ratio (ref 0.0–4.4)
Cholesterol, Total: 237 mg/dL — ABNORMAL HIGH (ref 100–199)
HDL: 78 mg/dL (ref >39)
LDL CALC: 139 mg/dL — AB (ref 0–99)
TRIGLYCERIDES: 100 mg/dL (ref 0–149)
VLDL Cholesterol Cal: 20 mg/dL (ref 5–40)

## 2015-05-14 LAB — BMP8+EGFR
BUN / CREAT RATIO: 17 (ref 9–23)
BUN: 9 mg/dL (ref 6–24)
CO2: 26 mmol/L (ref 18–29)
CREATININE: 0.52 mg/dL — AB (ref 0.57–1.00)
Calcium: 9 mg/dL (ref 8.7–10.2)
Chloride: 100 mmol/L (ref 97–108)
GFR calc Af Amer: 121 mL/min/{1.73_m2} (ref >59)
GFR, EST NON AFRICAN AMERICAN: 105 mL/min/{1.73_m2} (ref >59)
GLUCOSE: 87 mg/dL (ref 65–99)
POTASSIUM: 4.3 mmol/L (ref 3.5–5.2)
SODIUM: 141 mmol/L (ref 134–144)

## 2015-08-01 ENCOUNTER — Encounter: Payer: Self-pay | Admitting: Internal Medicine

## 2016-01-19 ENCOUNTER — Encounter: Payer: PRIVATE HEALTH INSURANCE | Admitting: *Deleted

## 2016-01-19 LAB — HM MAMMOGRAPHY

## 2016-01-29 ENCOUNTER — Encounter: Payer: Self-pay | Admitting: *Deleted

## 2016-11-26 ENCOUNTER — Ambulatory Visit (INDEPENDENT_AMBULATORY_CARE_PROVIDER_SITE_OTHER): Payer: PRIVATE HEALTH INSURANCE | Admitting: Family Medicine

## 2016-11-26 ENCOUNTER — Ambulatory Visit (INDEPENDENT_AMBULATORY_CARE_PROVIDER_SITE_OTHER): Payer: PRIVATE HEALTH INSURANCE

## 2016-11-26 ENCOUNTER — Ambulatory Visit: Payer: PRIVATE HEALTH INSURANCE

## 2016-11-26 VITALS — BP 120/77 | HR 98 | Temp 98.1°F

## 2016-11-26 DIAGNOSIS — R1032 Left lower quadrant pain: Secondary | ICD-10-CM

## 2016-11-26 MED ORDER — KETOROLAC TROMETHAMINE 60 MG/2ML IM SOLN
60.0000 mg | Freq: Once | INTRAMUSCULAR | Status: AC
  Administered 2016-11-26: 60 mg via INTRAMUSCULAR

## 2016-11-26 MED ORDER — BETAMETHASONE SOD PHOS & ACET 6 (3-3) MG/ML IJ SUSP
6.0000 mg | Freq: Once | INTRAMUSCULAR | Status: AC
  Administered 2016-11-26: 6 mg via INTRAMUSCULAR

## 2016-11-26 NOTE — Progress Notes (Signed)
Subjective:  Patient ID: Colleen Elliott, female    DOB: 1956-01-25  Age: 61 y.o. MRN: [id number]  CC: Leg Pain (pt here today c/o left inguinal area pain, it hurts especially when trying to get in and out of the car. The pain is a sharp shooting pain and it woke her up 2 nights ago. No history or recent injury.)   HPI Colleen Elliott presents for Acute onset during the night 3 nights ago of left groin pain. It woke her up out of sleep. It is described as excruciating. It leveled off shortly but since that time it's been increasing. It is in the left inguinal and groin area just under the inguinal ligament crease. She has had no injury. She says that what makes it worse is trying to step out of a car. She has a history of osteopenia and is concerned about fracture.   History Colleen Elliott has a past medical history of Allergy; Anemia; Glaucoma; Hematuria; Kidney stone; No pertinent past medical history; and Osteopenia.   She has a past surgical history that includes Tubal ligation.   Her family history includes Arthritis in her mother; Diabetes in her paternal aunt; Heart disease in her father; Hyperlipidemia in her brother.She reports that she has never smoked. She has never used smokeless tobacco. She reports that she drinks about 0.5 - 1.0 oz of alcohol per week . She reports that she does not use drugs.    ROS Review of Systems  Constitutional: Positive for activity change. Negative for fever.  HENT: Negative for congestion, rhinorrhea and sore throat.   Respiratory: Negative for shortness of breath.   Cardiovascular: Negative for chest pain.  Gastrointestinal: Negative for abdominal pain and nausea.  Genitourinary: Negative for dysuria.  Musculoskeletal: Positive for arthralgias and myalgias.    Objective:  BP 120/77   Pulse 98   Temp 98.1 F (36.7 C) (Oral)   LMP 01/25/2005   BP Readings from Last 3 Encounters:  11/26/16 120/77  05/12/15 121/71  07/01/14 90/60    Wt Readings from  Last 3 Encounters:  05/12/15 122 lb 3.2 oz (55.4 kg)  07/01/14 120 lb (54.4 kg)  06/17/14 118 lb 3.2 oz (53.6 kg)     Physical Exam  Constitutional: She is oriented to person, place, and time. She appears well-developed and well-nourished.  HENT:  Head: Normocephalic.  Cardiovascular: Normal rate and regular rhythm.   No murmur heard. Pulmonary/Chest: Effort normal and breath sounds normal.  Musculoskeletal: She exhibits tenderness (decreased abduction due to pain. Tender at adductor rendon for palpation and laterally  for extension).  Neurological: She is alert and oriented to person, place, and time. She exhibits normal muscle tone. Coordination normal.  Skin: Skin is warm and dry. No rash noted. No erythema. No pallor.  Psychiatric: She has a normal mood and affect.      Assessment & Plan:   Colleen Elliott was seen today for leg pain.  Diagnoses and all orders for this visit:  Left groin pain -     DG HIP UNILAT W OR W/O PELVIS 2-3 VIEWS LEFT; Future -     betamethasone acetate-betamethasone sodium phosphate (CELESTONE) injection 6 mg; Inject 1 mL (6 mg total) into the muscle once. -     ketorolac (TORADOL) injection 60 mg; Inject 2 mLs (60 mg total) into the muscle once. -     Ambulatory referral to Physical Therapy    Hip x-ray negative for fracture   I have discontinued Colleen Elliott ibuprofen  and fluticasone. We will continue to administer betamethasone acetate-betamethasone sodium phosphate and ketorolac.  Allergies as of 11/26/2016      Reactions   Ciprofloxacin    Bad headache      Medication List    as of 11/26/2016  4:37 PM   You have not been prescribed any medications.      Follow-up: Return if symptoms worsen or fail to improve.  Colleen Elliott, M.D.

## 2016-12-31 ENCOUNTER — Ambulatory Visit (INDEPENDENT_AMBULATORY_CARE_PROVIDER_SITE_OTHER): Payer: PRIVATE HEALTH INSURANCE | Admitting: Family Medicine

## 2016-12-31 ENCOUNTER — Encounter: Payer: Self-pay | Admitting: Family Medicine

## 2016-12-31 VITALS — BP 113/72 | HR 98 | Temp 97.8°F | Ht 60.25 in | Wt 133.0 lb

## 2016-12-31 DIAGNOSIS — J302 Other seasonal allergic rhinitis: Secondary | ICD-10-CM

## 2016-12-31 DIAGNOSIS — J0100 Acute maxillary sinusitis, unspecified: Secondary | ICD-10-CM

## 2016-12-31 MED ORDER — FEXOFENADINE-PSEUDOEPHED ER 180-240 MG PO TB24
1.0000 | ORAL_TABLET | Freq: Every evening | ORAL | 11 refills | Status: DC
Start: 2016-12-31 — End: 2017-08-30

## 2016-12-31 MED ORDER — AMOXICILLIN-POT CLAVULANATE 875-125 MG PO TABS: 1.0000 | ORAL_TABLET | Freq: Two times a day (BID) | ORAL | 0 refills | Status: DC

## 2016-12-31 MED ORDER — BETAMETHASONE SOD PHOS & ACET 6 (3-3) MG/ML IJ SUSP: 6.0000 mg | Freq: Once | INTRAMUSCULAR | Status: DC

## 2016-12-31 NOTE — Progress Notes (Signed)
Subjective:  Patient ID: Colleen Elliott, female    DOB: [birth year]  Age: 61 y.o. MRN: [id number]  CC: Sinusitis (pt here today c/o headache, nausea, congestion and facial swelling.)   HPI Colleen Elliott presents for Symptoms include congestion, facial pain, nasal congestion, post nasal drip and sinus pressure. There is no fever, chills, or sweats. Onset of symptoms was a few days ago, gradually worsening since that time.  For the last week she's had a lot of itching eyes and redness and swelling around her eyes. She brings in a photo of what she looked like this morning showing that there was a grade of erythema and edema which appeared urticarial on the infraorbital portion of the cheeks.   History Colleen Elliott has a past medical history of Allergy; Anemia; Glaucoma; Hematuria; Kidney stone; No pertinent past medical history; and Osteopenia.   She has a past surgical history that includes Tubal ligation.   Her family history includes Arthritis in her mother; Diabetes in her paternal aunt; Heart disease in her father; Hyperlipidemia in her brother.She reports that she has never smoked. She has never used smokeless tobacco. She reports that she drinks about 0.5 - 1.0 oz of alcohol per week . She reports that she does not use drugs.  No current outpatient prescriptions on file prior to visit.   No current facility-administered medications on file prior to visit.     ROS Review of Systems  Constitutional: Negative.   HENT: Positive for sinus pressure. Negative for sneezing.     Objective:  BP 113/72   Pulse 98   Temp 97.8 F (36.6 C) (Oral)   Ht 5' 0.25" (1.53 m)   Wt 133 lb (60.3 kg)   LMP 01/25/2005   BMI 25.76 kg/m   Physical Exam  Constitutional: She appears well-developed and well-nourished.  HENT:  Head: Normocephalic and atraumatic.  Right Ear: Tympanic membrane and external ear normal. No decreased hearing is noted.  Left Ear: Tympanic membrane and external ear normal. No  decreased hearing is noted.  Nose: Mucosal edema present. Right sinus exhibits no frontal sinus tenderness. Left sinus exhibits no frontal sinus tenderness.  Mouth/Throat: No oropharyngeal exudate or posterior oropharyngeal erythema.  Neck: No Brudzinski's sign noted.  Pulmonary/Chest: Breath sounds normal. No respiratory distress.    Assessment & Plan:   Colleen Elliott was seen today for sinusitis.  Diagnoses and all orders for this visit:  Chronic seasonal allergic rhinitis, unspecified trigger -     betamethasone acetate-betamethasone sodium phosphate (CELESTONE) injection 6 mg; Inject 1 mL (6 mg total) into the muscle once.  Acute maxillary sinusitis, recurrence not specified -     betamethasone acetate-betamethasone sodium phosphate (CELESTONE) injection 6 mg; Inject 1 mL (6 mg total) into the muscle once.  Other orders -     amoxicillin-clavulanate (AUGMENTIN) 875-125 MG tablet; Take 1 tablet by mouth 2 (two) times daily. Take all of this medication -     fexofenadine-pseudoephedrine (ALLEGRA-D 24) 180-240 MG 24 hr tablet; Take 1 tablet by mouth every evening. For allergy and congestion   I am having Colleen Elliott start on amoxicillin-clavulanate and fexofenadine-pseudoephedrine. I am also having her maintain her cetirizine, fluticasone, and naproxen sodium. We will continue to administer betamethasone acetate-betamethasone sodium phosphate.  Meds ordered this encounter  Medications  . cetirizine (ZYRTEC) 10 MG tablet    Sig: Take 10 mg by mouth daily.  . fluticasone (FLONASE) 50 MCG/ACT nasal spray    Sig: Place 1 spray into both nostrils daily.  Marland Kitchen  naproxen sodium (ANAPROX) 220 MG tablet    Sig: Take 220 mg by mouth 2 (two) times daily with a meal.  . amoxicillin-clavulanate (AUGMENTIN) 875-125 MG tablet    Sig: Take 1 tablet by mouth 2 (two) times daily. Take all of this medication    Dispense:  20 tablet    Refill:  0  . fexofenadine-pseudoephedrine (ALLEGRA-D 24) 180-240 MG 24 hr  tablet    Sig: Take 1 tablet by mouth every evening. For allergy and congestion    Dispense:  30 tablet    Refill:  11  . betamethasone acetate-betamethasone sodium phosphate (CELESTONE) injection 6 mg     Follow-up: Return if symptoms worsen or fail to improve.  Mechele Claude, M.D.

## 2017-01-01 ENCOUNTER — Ambulatory Visit: Payer: PRIVATE HEALTH INSURANCE

## 2017-08-30 ENCOUNTER — Ambulatory Visit (INDEPENDENT_AMBULATORY_CARE_PROVIDER_SITE_OTHER): Payer: PRIVATE HEALTH INSURANCE | Admitting: Family Medicine

## 2017-08-30 ENCOUNTER — Encounter: Payer: Self-pay | Admitting: Family Medicine

## 2017-08-30 VITALS — BP 108/71 | HR 105 | Temp 98.8°F | Ht 60.25 in | Wt 126.8 lb

## 2017-08-30 DIAGNOSIS — L509 Urticaria, unspecified: Secondary | ICD-10-CM

## 2017-08-30 MED ORDER — METHYLPREDNISOLONE ACETATE 80 MG/ML IJ SUSP
80.0000 mg | Freq: Once | INTRAMUSCULAR | Status: AC
  Administered 2017-08-30: 80 mg via INTRAMUSCULAR

## 2017-08-30 NOTE — Progress Notes (Signed)
   HPI  Patient presents today with eye swelling.  Patient explains the symptoms started about 2 or 3 weeks ago, today it seems to be getting worse she is also developed some redness around the area under her chin as well as on her left area of the forehead.  It is very itchy. She denies any changes in lotions, makeup, soaps.  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, she is concerned that it could be  PMH: Smoking status noted ROS: Per HPI  Objective: BP 108/71   Pulse (!) 105   Temp 98.8 F (37.1 C) (Oral)   Ht 5' 0.25" (1.53 m)   Wt 126 lb 12.8 oz (57.5 kg)   LMP 01/25/2005   BMI 24.56 kg/m  Gen: NAD, alert, cooperative with exam HEENT: NCAT, EOMI, PERRL, swelling beneath the eyes bilaterally slightly worse than versus the right, no tenderness to palpation of the globes CV: RRR, good S1/S2, no murmur Resp: CTABL, no wheezes, non-labored Abd: SNTND, BS present, no guarding or organomegaly Ext: No edema, warm Neuro: Alert and oriented, No gross deficits Skin:  Blotchy erythema on both sides of midline under the chin, also on the left forehead   Assessment and plan:  #Urticaria Patient with lateral eye swelling, splotchy erythema under her chin and also on her forehead. Treated with IM Depo-Medrol, recommended Zyrtec plus twice daily Zantac Look for the allergic cause, check make up expiration date.  Return to clinic with any failure to improve    Murtis SinkSam Wylan Gentzler, MD [facility] 08/30/2017, 4:16 PM

## 2017-08-30 NOTE — Patient Instructions (Signed)
Great to meet you!  I have given you an injection of Depo-Medrol today. I have also recommended that you start Zyrtec once daily for about 1 month, I would also recommend Zantac twice daily for about 1 week.  If you have persistent symptoms or symptoms suddenly worsen please let us know.   Hives Hives (urticaria) are itchy, red, swollen areas on your skin. Hives can show up on any part of your body, and they can vary in size. They can be as small as the tip of a pen or much larger. Hives often fade within 24 hours (acute hives). In other cases, new hives show up after old ones fade. This can continue for many days or weeks (chronic hives). Hives are caused by your body's reaction to an irritant or to something that you are allergic to (trigger). You can get hives right after being around a trigger or hours later. Hives do not spread from person to person (are not contagious). Hives may get worse if you scratch them, if you exercise, or if you have worries (emotional stress). Follow these instructions at home: Medicines  Take or apply over-the-counter and prescription medicines only as told by your doctor.  If you were prescribed an antibiotic medicine, use it as told by your doctor. Do not stop taking the antibiotic even if you start to feel better. Skin Care  Apply cool, wet cloths (cool compresses) to the itchy, red, swollen areas.  Do not scratch your skin. Do not rub your skin. General instructions  Do not take hot showers or baths. This can make itching worse.  Do not wear tight clothes.  Use sunscreen and wear clothing that covers your skin when you are outside.  Avoid any triggers that cause your hives. Keep a journal to help you keep track of what causes your hives. Write down: ? What medicines you take. ? What you eat and drink. ? What products you use on your skin.  Keep all follow-up visits as told by your doctor. This is important. Contact a doctor if:  Your symptoms  are not better with medicine.  Your joints are painful or swollen. Get help right away if:  You have a fever.  You have belly pain.  Your tongue or lips are swollen.  Your eyelids are swollen.  Your chest or throat feels tight.  You have trouble breathing or swallowing. These symptoms may be an emergency. Do not wait to see if the symptoms will go away. Get medical help right away. Call your local emergency services (911 in the [place] Do not drive yourself to the hospital. This information is not intended to replace advice given to you by your health care provider. Make sure you discuss any questions you have with your health care provider. Document Released: 06/22/2008 Document Revised: 02/19/2016 Document Reviewed: 07/02/2015 Elsevier Interactive Patient Education  2018 [employer]

## 2017-08-30 NOTE — Addendum Note (Signed)
Addended by: Lorelee CoverOSTOSKY, JESSICA C on: 08/30/2017 04:19 PM   Modules accepted: Orders

## 2017-09-12 ENCOUNTER — Ambulatory Visit (INDEPENDENT_AMBULATORY_CARE_PROVIDER_SITE_OTHER): Payer: PRIVATE HEALTH INSURANCE | Admitting: Family Medicine

## 2017-09-12 ENCOUNTER — Encounter: Payer: Self-pay | Admitting: Family Medicine

## 2017-09-12 VITALS — BP 113/73 | HR 118 | Temp 98.0°F | Ht 60.25 in | Wt 126.0 lb

## 2017-09-12 DIAGNOSIS — H5789 Other specified disorders of eye and adnexa: Secondary | ICD-10-CM | POA: Diagnosis not present

## 2017-09-12 DIAGNOSIS — H1010 Acute atopic conjunctivitis, unspecified eye: Secondary | ICD-10-CM

## 2017-09-12 DIAGNOSIS — M25551 Pain in right hip: Secondary | ICD-10-CM

## 2017-09-12 DIAGNOSIS — R002 Palpitations: Secondary | ICD-10-CM

## 2017-09-12 MED ORDER — PREDNISONE 10 MG PO TABS: ORAL_TABLET | ORAL | 0 refills | Status: DC

## 2017-09-12 MED ORDER — METHYLPREDNISOLONE ACETATE 80 MG/ML IJ SUSP
80.0000 mg | Freq: Once | INTRAMUSCULAR | Status: AC
  Administered 2017-09-12: 80 mg via INTRAMUSCULAR

## 2017-09-12 MED ORDER — FLUTICASONE PROPIONATE 50 MCG/ACT NA SUSP: 2.0000 | Freq: Every day | NASAL | 6 refills | Status: DC

## 2017-09-12 MED ORDER — KETOROLAC TROMETHAMINE 0.5 % OP SOLN: 1.0000 [drp] | Freq: Four times a day (QID) | OPHTHALMIC | 0 refills | Status: DC

## 2017-09-12 NOTE — Progress Notes (Signed)
Subjective:    Patient ID: Colleen Elliott, female    DOB: 11/04/1955, 61 y.o.   MRN: [id number]  HPI Patient here today for follow up on swollen, itchy and watery eyes. She also has some joint pain and lower extremity swelling.  Patient has a history of allergies and urticaria.  She is also having some joint pains and swelling in her legs.  She is currently not taking any medication.  The patient denies any chest pain or shortness of breath.  She has had the itchy watery eyes for about 2 months and has been on a short round of cortisone along with Allegra and Zantac and Benadryl.  She is has some joint swelling for about 2 weeks with the hips and legs hurting her the most and it seems to emanate from her low back.  She did say her mother had rheumatoid arthritis.  She denies any hot swollen joints just joint soreness.  He is not had any blood work in a while.  She lives and I have also has a heat pump and does have an overhead fan which she does not use.  She denies any chest pain.  She does occasionally have some trouble with swallowing but not regularly she denies any heartburn blood in the stool or black tarry bowel movements.  She is passing her water without problems.    Patient Active Problem List   Diagnosis Date Noted  . Diabetes mellitus screening 05/12/2015  . Screening for lipoid disorders 05/12/2015  . Acute hepatitis 03/04/2012  . Abdominal discomfort, epigastric 03/02/2012  . Transaminitis 03/02/2012   Outpatient Encounter Medications as of 09/12/2017  Medication Sig  . [DISCONTINUED] fluticasone (FLONASE) 50 MCG/ACT nasal spray Place 1 spray into both nostrils daily.  . [DISCONTINUED] betamethasone acetate-betamethasone sodium phosphate (CELESTONE) injection 6 mg    No facility-administered encounter medications on file as of 09/12/2017.      Review of Systems  Constitutional: Negative.   HENT: Negative.   Eyes: Positive for discharge (swollen as well), redness and itching.    Respiratory: Negative.   Cardiovascular: Negative.   Gastrointestinal: Negative.   Endocrine: Negative.   Genitourinary: Negative.   Musculoskeletal: Positive for arthralgias (with lower extrm. swelling).  Skin: Negative.   Allergic/Immunologic: Negative.   Neurological: Negative.   Hematological: Negative.   Psychiatric/Behavioral: Negative.        Objective:   Physical Exam  Constitutional: She is oriented to person, place, and time. She appears well-developed and well-nourished. No distress.  HENT:  Head: Normocephalic and atraumatic.  Right Ear: External ear normal.  Left Ear: External ear normal.  Mouth/Throat: Oropharynx is clear and moist. No oropharyngeal exudate.  Nasal congestion and turbinate swelling bilaterally  Eyes: Conjunctivae and EOM are normal. Pupils are equal, round, and reactive to light. Right eye exhibits no discharge. Left eye exhibits no discharge. No scleral icterus.  Dryness around the eyes  Neck: Normal range of motion. Neck supple. No thyromegaly present.  No bruits thyromegaly or anterior cervical adenopathy  Cardiovascular: Normal rate, regular rhythm and normal heart sounds.  No murmur heard. Pulmonary/Chest: Effort normal and breath sounds normal. No respiratory distress. She has no wheezes. She has no rales.  Clear anteriorly and posteriorly  Abdominal: Soft. Bowel sounds are normal. She exhibits no mass. There is no tenderness. There is no rebound and no guarding.  Slight epigastric and left lower quadrant tenderness  Musculoskeletal: Normal range of motion. She exhibits no edema.  No joint line tenderness  in either knee.  There was some pain with leg raising and hip abduction right greater than left.  Lymphadenopathy:    She has no cervical adenopathy.  Neurological: She is alert and oriented to person, place, and time. She has normal reflexes. No cranial nerve deficit.  Skin: Skin is warm and dry. No rash noted.  Psychiatric: She has a  normal mood and affect. Her behavior is normal. Judgment and thought content normal.  Nursing note and vitals reviewed.  BP 113/73 (BP Location: Left Arm)   Pulse (!) 118   Temp 98 F (36.7 C) (Oral)   Ht 5' 0.25" (1.53 m)   Wt 126 lb (57.2 kg)   LMP 01/25/2005   BMI 24.40 kg/m         Assessment & Plan:  1. Eye swelling -Use Acular as directed and take prednisone tapering dose over 8 days - CYCLIC CITRUL PEPTIDE ANTIBODY, IGG/IGA - methylPREDNISolone acetate (DEPO-MEDROL) injection 80 mg - BMP8+EGFR; Future - Lipid panel; Future - Thyroid Panel With TSH; Future - Hepatic function panel; Future - Arthritis Panel; Future - CYCLIC CITRUL PEPTIDE ANTIBODY, IGG/IGA; Future  2. Eye irritation -Use Acular and prednisone and Depo-Medrol as directed - CYCLIC CITRUL PEPTIDE ANTIBODY, IGG/IGA - methylPREDNISolone acetate (DEPO-MEDROL) injection 80 mg - BMP8+EGFR; Future - Lipid panel; Future - Thyroid Panel With TSH; Future - Hepatic function panel; Future - Arthritis Panel; Future - CYCLIC CITRUL PEPTIDE ANTIBODY, IGG/IGA; Future  3. Allergic conjunctivitis, unspecified laterality -Flonase 1 spray each nostril daily at bedtime  4. Right hip pain -Mother has history of rheumatoid arthritis.  This patient has other joints that are also hurting her.  Both knees and both hips.  We will do the arthritis profile as indicated above to further evaluate these findings.  5. Heart palpitations -Decrease caffeine intake  Meds ordered this encounter  Medications  . predniSONE (DELTASONE) 10 MG tablet    Sig: Take 1 tab QID x 2 days, then 1 tab TID x 2 days, then 1 tab BID x 2 days, then 1 tab QD x 2 days.    Dispense:  20 tablet    Refill:  0  . methylPREDNISolone acetate (DEPO-MEDROL) injection 80 mg  . fluticasone (FLONASE) 50 MCG/ACT nasal spray    Sig: Place 2 sprays into both nostrils daily.    Dispense:  16 g    Refill:  6  . ketorolac (ACULAR) 0.5 % ophthalmic solution     Sig: Place 1 drop into both eyes 4 (four) times daily.    Dispense:  5 mL    Refill:  0   Patient Instructions  Try Acular drops 1 drop to each eye 4 times daily Depo-Medrol 60 x 1 IM Start prednisone tomorrow a tapering dose over 8 days with 10 mg Use cool mist humidifier in the home and stay well-hydrated and did not use an overhead fan Keep the house as cool as possible Continue to take the Zantac and the [place] lab work and review all this with the patient in 3-4 weeks as to how she is doing at that time.  She may need referral to an allergy specialist if she continues to have flares with her eyes and allergies. Also she should use Flonase 1 spray each nostril over-the-counter. Also do not forget to discontinue the caffeine Use detergents fabric softeners and soaps that are scent free Try to refrain from using makeup on the face and if any makeup is used, use water-based  makeup like Olene Craven MD

## 2017-09-12 NOTE — Patient Instructions (Addendum)
Try Acular drops 1 drop to each eye 4 times daily Depo-Medrol 60 x 1 IM Start prednisone tomorrow a tapering dose over 8 days with 10 mg Use cool mist humidifier in the home and stay well-hydrated and did not use an overhead fan Keep the house as cool as possible Continue to take the Zantac and the Allegra Get lab work and review all this with the patient in 3-4 weeks as to how she is doing at that time.  She may need referral to an allergy specialist if she continues to have flares with her eyes and allergies. Also she should use Flonase 1 spray each nostril over-the-counter. Also do not forget to discontinue the caffeine Use detergents fabric softeners and soaps that are scent free Try to refrain from using makeup on the face and if any makeup is used, use water-based makeup like [employer]

## 2017-09-13 ENCOUNTER — Other Ambulatory Visit: Payer: PRIVATE HEALTH INSURANCE

## 2017-09-13 NOTE — Addendum Note (Signed)
Addended by: Prescott GumLAND, Mikias Lanz M on: 09/13/2017 08:24 AM   Modules accepted: Orders

## 2017-09-14 LAB — BMP8+EGFR
BUN / CREAT RATIO: 20 (ref 12–28)
BUN: 15 mg/dL (ref 8–27)
CO2: 26 mmol/L (ref 20–29)
CREATININE: 0.74 mg/dL (ref 0.57–1.00)
Calcium: 9.1 mg/dL (ref 8.7–10.3)
Chloride: 105 mmol/L (ref 96–106)
GFR calc Af Amer: 101 mL/min/{1.73_m2} (ref >59)
GFR calc non Af Amer: 88 mL/min/{1.73_m2} (ref >59)
GLUCOSE: 84 mg/dL (ref 65–99)
Potassium: 4.3 mmol/L (ref 3.5–5.2)
SODIUM: 142 mmol/L (ref 134–144)

## 2017-09-14 LAB — ARTHRITIS PANEL
BASOS: 0 %
Basophils Absolute: 0 10*3/uL (ref 0.0–0.2)
EOS (ABSOLUTE): 0.1 10*3/uL (ref 0.0–0.4)
EOS: 1 %
HEMOGLOBIN: 12.8 g/dL (ref 11.1–15.9)
Hematocrit: 39.1 % (ref 34.0–46.6)
IMMATURE GRANS (ABS): 0 10*3/uL (ref 0.0–0.1)
Immature Granulocytes: 0 %
LYMPHS: 32 %
Lymphocytes Absolute: 2.1 10*3/uL (ref 0.7–3.1)
MCH: 30.8 pg (ref 26.6–33.0)
MCHC: 32.7 g/dL (ref 31.5–35.7)
MCV: 94 fL (ref 79–97)
MONOCYTES: 5 %
Monocytes Absolute: 0.3 10*3/uL (ref 0.1–0.9)
NEUTROS ABS: 4.2 10*3/uL (ref 1.4–7.0)
Neutrophils: 62 %
Platelets: 297 10*3/uL (ref 150–379)
RBC: 4.16 x10E6/uL (ref 3.77–5.28)
RDW: 13.4 % (ref 12.3–15.4)
Rhuematoid fact SerPl-aCnc: <10 IU/mL (ref 0.0–13.9)
SED RATE: 13 mm/h (ref 0–40)
Uric Acid: 3.6 mg/dL (ref 2.5–7.1)
WBC: 6.7 10*3/uL (ref 3.4–10.8)

## 2017-09-14 LAB — HEPATIC FUNCTION PANEL
ALK PHOS: 76 IU/L (ref 39–117)
ALT: 12 IU/L (ref 0–32)
AST: 11 IU/L (ref 0–40)
Albumin: 4 g/dL (ref 3.6–4.8)
Bilirubin Total: 0.3 mg/dL (ref 0.0–1.2)
Bilirubin, Direct: 0.06 mg/dL (ref 0.00–0.40)
Total Protein: 6.5 g/dL (ref 6.0–8.5)

## 2017-09-14 LAB — THYROID PANEL WITH TSH
Free Thyroxine Index: 1.9 (ref 1.2–4.9)
T3 Uptake Ratio: 25 % (ref 24–39)
T4 TOTAL: 7.7 ug/dL (ref 4.5–12.0)
TSH: 3.38 u[IU]/mL (ref 0.450–4.500)

## 2017-09-14 LAB — CYCLIC CITRUL PEPTIDE ANTIBODY, IGG/IGA: Cyclic Citrullin Peptide Ab: 5 units (ref 0–19)

## 2017-09-14 LAB — LIPID PANEL
Chol/HDL Ratio: 2.8 ratio (ref 0.0–4.4)
Cholesterol, Total: 220 mg/dL — ABNORMAL HIGH (ref 100–199)
HDL: 79 mg/dL (ref >39)
LDL Calculated: 131 mg/dL — ABNORMAL HIGH (ref 0–99)
Triglycerides: 50 mg/dL (ref 0–149)
VLDL CHOLESTEROL CAL: 10 mg/dL (ref 5–40)

## 2017-10-06 ENCOUNTER — Ambulatory Visit (INDEPENDENT_AMBULATORY_CARE_PROVIDER_SITE_OTHER): Payer: PRIVATE HEALTH INSURANCE | Admitting: Family Medicine

## 2017-10-06 ENCOUNTER — Encounter: Payer: Self-pay | Admitting: Family Medicine

## 2017-10-06 VITALS — BP 109/72 | HR 104 | Temp 97.4°F | Ht 60.25 in | Wt 127.0 lb

## 2017-10-06 DIAGNOSIS — H5789 Other specified disorders of eye and adnexa: Secondary | ICD-10-CM | POA: Diagnosis not present

## 2017-10-06 DIAGNOSIS — L209 Atopic dermatitis, unspecified: Secondary | ICD-10-CM | POA: Diagnosis not present

## 2017-10-06 DIAGNOSIS — H1010 Acute atopic conjunctivitis, unspecified eye: Secondary | ICD-10-CM

## 2017-10-06 NOTE — Patient Instructions (Addendum)
Try the Eucrisa and apply a small amount once daily to the upper lid area at bedtime.  Use as little as possible you might start with just one application 3 times a week for 1 application once a week. Remember to switch makeup to water-based makeup Continue using humidification Keep the house as cool as possible and do not use any overhead fans Use scent free fabric softener soaps and detergents Keep the Acular eyedrops on hand and use only if needed in the future

## 2017-10-06 NOTE — Progress Notes (Signed)
Subjective:    Patient ID: Colleen Elliott, female    DOB: 09/26/1956, 62 y.o.   MRN: [id number]  HPI Patient here today for 3 week follow up on dry eye.  The patient is much better after using Acular and taking prednisone.  She also stopped using overhead fan.  Her eyes have improved although there is still some problems and she is only using Flonase now.  I did go over the recent lab work and everything was normal other than elevated cholesterol we will make sure that she got a Zetia diet sheet.  The eyes and eye lid problems are much improved with the recommendations that she proceeded with from the last visit.     Patient Active Problem List   Diagnosis Date Noted  . Diabetes mellitus screening 05/12/2015  . Screening for lipoid disorders 05/12/2015  . Acute hepatitis 03/04/2012  . Abdominal discomfort, epigastric 03/02/2012  . Transaminitis 03/02/2012   Outpatient Encounter Medications as of 10/06/2017  Medication Sig  . fluticasone (FLONASE) 50 MCG/ACT nasal spray Place 2 sprays into both nostrils daily.  . [DISCONTINUED] ketorolac (ACULAR) 0.5 % ophthalmic solution Place 1 drop into both eyes 4 (four) times daily.  . [DISCONTINUED] predniSONE (DELTASONE) 10 MG tablet Take 1 tab QID x 2 days, then 1 tab TID x 2 days, then 1 tab BID x 2 days, then 1 tab QD x 2 days.   No facility-administered encounter medications on file as of 10/06/2017.       Review of Systems  Constitutional: Negative.   HENT: Negative.   Eyes: Negative.        Dry eye lid  Respiratory: Negative.   Cardiovascular: Negative.   Gastrointestinal: Negative.   Endocrine: Negative.   Genitourinary: Negative.   Musculoskeletal: Negative.   Skin: Negative.   Allergic/Immunologic: Negative.   Neurological: Negative.   Hematological: Negative.   Psychiatric/Behavioral: Negative.        Objective:   Physical Exam  Constitutional: She is oriented to person, place, and time. She appears well-developed and  well-nourished. No distress.  Pleasant and alert  HENT:  Head: Normocephalic and atraumatic.  Eyes: Conjunctivae and EOM are normal. Pupils are equal, round, and reactive to light. Right eye exhibits no discharge. Left eye exhibits no discharge. No scleral icterus.  Neck: Normal range of motion.  Musculoskeletal: Normal range of motion.  Neurological: She is alert and oriented to person, place, and time.  Skin: Skin is warm and dry.  Psychiatric: She has a normal mood and affect. Her behavior is normal. Judgment and thought content normal.  Nursing note and vitals reviewed.  BP 109/72 (BP Location: Left Arm)   Pulse (!) 104   Temp (!) 97.4 F (36.3 C) (Oral)   Ht 5' 0.25" (1.53 m)   Wt 127 lb (57.6 kg)   LMP 01/25/2005   BMI 24.60 kg/m        Assessment & Plan:  1. Allergic conjunctivitis, unspecified laterality -Continue with water base makeup -Continue with humidification and avoiding the use of overhead fan   2. Eye irritation -As above  3. Atopic dermatitis, unspecified type -Try Eucrisa sparingly as directed  Patient Instructions  Try the Eucrisa and apply a small amount once daily to the upper lid area at bedtime.  Use as little as possible you might start with just one application 3 times a week for 1 application once a week. Remember to switch makeup to water-based makeup Continue using humidification Keep the house  as cool as possible and do not use any overhead fans Use scent free fabric softener soaps and detergents Keep the Acular eyedrops on hand and use only if needed in the future   Nyra Capes MD

## 2018-08-10 ENCOUNTER — Other Ambulatory Visit (HOSPITAL_COMMUNITY)
Admission: RE | Admit: 2018-08-10 | Discharge: 2018-08-10 | Disposition: A | Discharge status: Home / Self Care | Payer: PRIVATE HEALTH INSURANCE | Source: Ambulatory Visit | Attending: Obstetrics and Gynecology | Admitting: Obstetrics and Gynecology

## 2018-08-10 ENCOUNTER — Other Ambulatory Visit: Payer: Self-pay

## 2018-08-10 ENCOUNTER — Ambulatory Visit (INDEPENDENT_AMBULATORY_CARE_PROVIDER_SITE_OTHER): Payer: PRIVATE HEALTH INSURANCE | Admitting: Obstetrics and Gynecology

## 2018-08-10 ENCOUNTER — Encounter: Payer: Self-pay | Admitting: Obstetrics and Gynecology

## 2018-08-10 VITALS — BP 112/82 | HR 76 | Ht 60.24 in | Wt 129.0 lb

## 2018-08-10 DIAGNOSIS — Z01419 Encounter for gynecological examination (general) (routine) without abnormal findings: Secondary | ICD-10-CM | POA: Diagnosis not present

## 2018-08-10 DIAGNOSIS — R350 Frequency of micturition: Secondary | ICD-10-CM | POA: Diagnosis not present

## 2018-08-10 DIAGNOSIS — Z124 Encounter for screening for malignant neoplasm of cervix: Secondary | ICD-10-CM | POA: Diagnosis not present

## 2018-08-10 DIAGNOSIS — Z780 Asymptomatic menopausal state: Secondary | ICD-10-CM

## 2018-08-10 DIAGNOSIS — M8580 Other specified disorders of bone density and structure, unspecified site: Secondary | ICD-10-CM

## 2018-08-10 DIAGNOSIS — E2839 Other primary ovarian failure: Secondary | ICD-10-CM

## 2018-08-10 DIAGNOSIS — Z0000 Encounter for general adult medical examination without abnormal findings: Secondary | ICD-10-CM

## 2018-08-10 DIAGNOSIS — N3090 Cystitis, unspecified without hematuria: Secondary | ICD-10-CM

## 2018-08-10 DIAGNOSIS — E559 Vitamin D deficiency, unspecified: Secondary | ICD-10-CM

## 2018-08-10 LAB — POCT URINALYSIS DIPSTICK
Bilirubin, UA: NEGATIVE
Blood, UA: POSITIVE
GLUCOSE UA: NEGATIVE
Ketones, UA: NEGATIVE
NITRITE UA: NEGATIVE
PH UA: 7 (ref 5.0–8.0)
Protein, UA: NEGATIVE
SPEC GRAV UA: 1.01 (ref 1.010–1.025)
Urobilinogen, UA: 0.2 E.U./dL

## 2018-08-10 MED ORDER — SULFAMETHOXAZOLE-TRIMETHOPRIM 800-160 MG PO TABS: 1.0000 | ORAL_TABLET | Freq: Two times a day (BID) | ORAL | 0 refills | Status: DC

## 2018-08-10 MED ORDER — PHENAZOPYRIDINE HCL 200 MG PO TABS: 200.0000 mg | ORAL_TABLET | Freq: Three times a day (TID) | ORAL | 0 refills | Status: DC | PRN

## 2018-08-10 NOTE — Patient Instructions (Signed)
EXERCISE AND DIET:  We recommended that you start or continue a regular exercise program for good health. Regular exercise means any activity that makes your heart beat faster and makes you sweat.  We recommend exercising at least 30 minutes per day at least 3 days a week, preferably 4 or 5.  We also recommend a diet low in fat and sugar.  Inactivity, poor dietary choices and obesity can cause diabetes, heart attack, stroke, and kidney damage, among others.    ALCOHOL AND SMOKING:  Women should limit their alcohol intake to no more than 7 drinks/beers/glasses of wine (combined, not each!) per week. Moderation of alcohol intake to this level decreases your risk of breast cancer and liver damage. And of course, no recreational drugs are part of a healthy lifestyle.  And absolutely no smoking or even second hand smoke. Most people know smoking can cause heart and lung diseases, but did you know it also contributes to weakening of your bones? Aging of your skin?  Yellowing of your teeth and nails?  CALCIUM AND VITAMIN D:  Adequate intake of calcium and Vitamin D are recommended.  The recommendations for exact amounts of these supplements seem to change often, but generally speaking 600 mg of calcium (either carbonate or citrate) and 800 units of Vitamin D per day seems prudent. Certain women may benefit from higher intake of Vitamin D.  If you are among these women, your doctor will have told you during your visit.    PAP SMEARS:  Pap smears, to check for cervical cancer or precancers,  have traditionally been done yearly, although recent scientific advances have shown that most women can have pap smears less often.  However, every woman still should have a physical exam from her gynecologist every year. It will include a breast check, inspection of the vulva and vagina to check for abnormal growths or skin changes, a visual exam of the cervix, and then an exam to evaluate the size and shape of the uterus and  ovaries.  And after 62 years of age, a rectal exam is indicated to check for rectal cancers. We will also provide age appropriate advice regarding health maintenance, like when you should have certain vaccines, screening for sexually transmitted diseases, bone density testing, colonoscopy, mammograms, etc.   MAMMOGRAMS:  All women over 40 years old should have a yearly mammogram. Many facilities now offer a "3D" mammogram, which may cost around $50 extra out of pocket. If possible,  we recommend you accept the option to have the 3D mammogram performed.  It both reduces the number of women who will be called back for extra views which then turn out to be normal, and it is better than the routine mammogram at detecting truly abnormal areas.    COLONOSCOPY:  Colonoscopy to screen for colon cancer is recommended for all women at age 50.  We know, you hate the idea of the prep.  We agree, BUT, having colon cancer and not knowing it is worse!!  Colon cancer so often starts as a polyp that can be seen and removed at colonscopy, which can quite literally save your life!  And if your first colonoscopy is normal and you have no family history of colon cancer, most women don't have to have it again for 10 years.  Once every ten years, you can do something that may end up saving your life, right?  We will be happy to help you get it scheduled when you are ready.    Be sure to check your insurance coverage so you understand how much it will cost.  It may be covered as a preventative service at no cost, but you should check your particular policy.     Mammogram Facilities  Yearly screening mammograms are recommended for women beginning at age 40. For a routine screening mammogram, you may schedule the appointment and have it done at the location of your choice.  Please ask the facility to send the results to our office. (fax [phone]) Location options include:  *[facility] [address], [address] [place], [place] [address] [phone]  [facility] [address], [address] [place], [place] [address] [phone]   Breast Self-Awareness Breast self-awareness means being familiar with how your breasts look and feel. It involves checking your breasts regularly and reporting any changes to your health care provider. Practicing breast self-awareness is important. A change in your breasts can be a sign of a serious medical problem. Being familiar with how your breasts look and feel allows you to find any problems early, when treatment is more likely to be successful. All women should practice breast self-awareness, including women who have had breast implants. How to do a breast self-exam One way to learn what is normal for your breasts and whether your breasts are changing is to do a breast self-exam. To do a breast self-exam: Look for Changes  1. Remove all the clothing above your waist. 2. Stand in front of a mirror in a room with good lighting. 3. Put your hands on your hips. 4. Push your hands firmly downward. 5. Compare your breasts in the mirror. Look for differences between them (asymmetry), such as: ? Differences in shape. ? Differences in size. ? Puckers, dips, and bumps in one breast and not the other. 6. Look at each breast for changes in your skin, such as: ? Redness. ? Scaly areas. 7. Look for changes in your nipples, such as: ? Discharge. ? Bleeding. ? Dimpling. ? Redness. ? A change in position. Feel for Changes  Carefully feel your breasts for lumps and changes. It is best to do this while lying on your back on the floor and again while sitting or standing in the shower or tub with soapy water on your skin. Feel each breast in the following way:  Place the arm on the side of the breast you are examining above your head.  Feel your breast with the other hand.  Start in the nipple area and make  inch (2 cm) overlapping circles to  feel your breast. Use the pads of your three middle fingers to do this. Apply light pressure, then medium pressure, then firm pressure. The light pressure will allow you to feel the tissue closest to the skin. The medium pressure will allow you to feel the tissue that is a little deeper. The firm pressure will allow you to feel the tissue close to the ribs.  Continue the overlapping circles, moving downward over the breast until you feel your ribs below your breast.  Move one finger-width toward the center of the body. Continue to use the  inch (2 cm) overlapping circles to feel your breast as you move slowly up toward your collarbone.  Continue the up and down exam using all three pressures until you reach your armpit.  Write Down What You Find  Write down what is normal for each breast and any changes that you find. Keep a written record   with breast changes or normal findings for each breast. By writing this information down, you do not need to depend only on memory for size, tenderness, or location. Write down where you are in your menstrual cycle, if you are still menstruating. If you are having trouble noticing differences in your breasts, do not get discouraged. With time you will become more familiar with the variations in your breasts and more comfortable with the exam. How often should I examine my breasts? Examine your breasts every month. If you are breastfeeding, the best time to examine your breasts is after a feeding or after using a breast pump. If you menstruate, the best time to examine your breasts is 5-7 days after your period is over. During your period, your breasts are lumpier, and it may be more difficult to notice changes. When should I see my health care provider? See your health care provider if you notice:  A change in shape or size of your breasts or nipples.  A change in the skin of your breast or nipples, such as a reddened or scaly area.  Unusual discharge from  your nipples.  A lump or thick area that was not there before.  Pain in your breasts.  Anything that concerns you.  This information is not intended to replace advice given to you by your health care provider. Make sure you discuss any questions you have with your health care provider. Document Released: 09/13/2005 Document Revised: 02/19/2016 Document Reviewed: 08/03/2015 Elsevier Interactive Patient Education  2018 [employer]  

## 2018-08-10 NOTE — Addendum Note (Signed)
Addended by: Tobi BastosJERTSON, Ananiah Maciolek E on: 08/10/2018 04:56 PM   Modules accepted: Orders

## 2018-08-10 NOTE — Progress Notes (Addendum)
62 y.o. G2P1 Married White or Caucasian Not Hispanic or Latino female here for annual exam.   She c/o a one week h/o lower back pain and urinary frequency, nocturia 3-4 x a night (normally none), urgency to void, no dysuria. Some urge incontinence in the last week. No fevers or flank pain. Some headache. No vaginal bleeding. Sexually active, no discomfort.  H/O tolerable GSI.     Patient's last menstrual period was 01/25/2005.          Sexually active: Yes.    The current method of family planning is tubal ligation, postmenopausal.   Exercising: Yes.    walking Smoker:  no  Health Maintenance: Pap:  06/01/2013 WNL History of abnormal Pap:  no MMG: 01/19/2016 Birads 2 benign BMD:   09/08/2011, previously on Fosamax (had GI upset)  Colonoscopy: 2011, WNL TDaP:   Unsure Gardasil: N/A   reports that she has never smoked. She has never used smokeless tobacco. She reports that she drinks about 1.0 standard drinks of alcohol per week. She reports that she does not use drugs. Retired for 3 months, having fun. 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 1 daughter and 2 grandchildren (local). Grand kids are 60 and 20.   Past Medical History:  Diagnosis Date  . Allergy    seasonal  . Amenorrhea   . Anemia   . Glaucoma    trace  . Hematuria    negative work up  . Kidney stone   . No pertinent past medical history   . Osteopenia     Past Surgical History:  Procedure Laterality Date  . TUBAL LIGATION      Current Outpatient Medications  Medication Sig Dispense Refill  . fluticasone (FLONASE) 50 MCG/ACT nasal spray Place 2 sprays into both nostrils daily. (Patient not taking: Reported on 08/10/2018) 16 g 6   No current facility-administered medications for this visit.     Family History  Problem Relation Age of Onset  . Arthritis Mother   . Heart disease Father   . Diabetes Paternal Aunt   . Heart  disease Paternal Aunt   . Hyperlipidemia Brother   . Uterine cancer Maternal Aunt   . Ovarian cancer Maternal Aunt   . Heart disease Paternal Uncle     Review of Systems  Constitutional: Negative.   HENT: Negative.   Eyes: Negative.   Respiratory: Negative.   Cardiovascular: Negative.   Gastrointestinal: Negative.   Endocrine: Negative.   Genitourinary: Positive for frequency and urgency.  Musculoskeletal:       Sharp/burning pain in left leg  Skin: Negative.   Allergic/Immunologic: Negative.   Neurological: Negative.   Hematological: Negative.   Psychiatric/Behavioral: Negative.   F/u with primary for leg c/o  Exam:   BP 112/82 (BP Location: Right Arm, Patient Position: Sitting, Cuff Size: Normal)   Pulse 76   Ht 5' 0.24" (1.53 m)   Wt 129 lb (58.5 kg)   LMP 01/25/2005   BMI 25.00 kg/m   Weight change: @WEIGHTCHANGE @ Height:   Height: 5' 0.24" (153 cm)  Ht Readings from Last 3 Encounters:  08/10/18 5' 0.24" (1.53 m)  10/06/17 5' 0.25" (1.53 m)  09/12/17 5' 0.25" (1.53 m)    General appearance: alert, cooperative and appears stated age Head: Normocephalic, without obvious abnormality, atraumatic Neck: no adenopathy, supple, symmetrical, trachea midline and thyroid normal to inspection and palpation  Lungs: clear to auscultation bilaterally Cardiovascular: regular rate and rhythm Breasts: normal appearance, no masses or tenderness Abdomen: soft, non-tender; non distended,  no masses,  no organomegaly Extremities: extremities normal, atraumatic, no cyanosis or edema Skin: Skin color, texture, turgor normal. No rashes or lesions Lymph nodes: Cervical, supraclavicular, and axillary nodes normal. No abnormal inguinal nodes palpated Neurologic: Grossly normal   Pelvic: External genitalia:  no lesions              Urethra:  normal appearing urethra with no masses, tenderness or lesions              Bartholins and Skenes: normal                 Vagina: atrophic  appearing vagina with normal color and discharge, no lesions              Cervix: no lesions               Bimanual Exam:  Uterus:  normal size, contour, position, consistency, mobility, non-tender              Adnexa: no mass, fullness, tenderness               Rectovaginal: Confirms               Anus:  normal sphincter tone, no lesions  Chaperone was present for exam.  A:  Well Woman with normal exam  Cystitis  Osteopenia  H/o vit d def  P:   Screenings labs and vit d  Mammogram due  Dexa due  Colonoscopy UTD  Discussed breast self exam  Discussed calcium and vit D intake  Send urine for ua, c&s  Pap with hpv

## 2018-08-11 LAB — LIPID PANEL
CHOL/HDL RATIO: 3.3 ratio (ref 0.0–4.4)
Cholesterol, Total: 241 mg/dL — ABNORMAL HIGH (ref 100–199)
HDL: 74 mg/dL (ref >39)
LDL CALC: 147 mg/dL — AB (ref 0–99)
Triglycerides: 100 mg/dL (ref 0–149)
VLDL Cholesterol Cal: 20 mg/dL (ref 5–40)

## 2018-08-11 LAB — COMPREHENSIVE METABOLIC PANEL
A/G RATIO: 1.6 (ref 1.2–2.2)
ALT: 24 IU/L (ref 0–32)
AST: 19 IU/L (ref 0–40)
Albumin: 4.3 g/dL (ref 3.6–4.8)
Alkaline Phosphatase: 91 IU/L (ref 39–117)
BUN/Creatinine Ratio: 17 (ref 12–28)
BUN: 12 mg/dL (ref 8–27)
Bilirubin Total: 0.3 mg/dL (ref 0.0–1.2)
CALCIUM: 9.6 mg/dL (ref 8.7–10.3)
CO2: 24 mmol/L (ref 20–29)
Chloride: 101 mmol/L (ref 96–106)
Creatinine, Ser: 0.71 mg/dL (ref 0.57–1.00)
GFR, EST AFRICAN AMERICAN: 106 mL/min/{1.73_m2} (ref >59)
GFR, EST NON AFRICAN AMERICAN: 92 mL/min/{1.73_m2} (ref >59)
GLOBULIN, TOTAL: 2.7 g/dL (ref 1.5–4.5)
Glucose: 88 mg/dL (ref 65–99)
Potassium: 4.3 mmol/L (ref 3.5–5.2)
SODIUM: 140 mmol/L (ref 134–144)
TOTAL PROTEIN: 7 g/dL (ref 6.0–8.5)

## 2018-08-11 LAB — CBC
HEMATOCRIT: 41.4 % (ref 34.0–46.6)
Hemoglobin: 13.5 g/dL (ref 11.1–15.9)
MCH: 30.8 pg (ref 26.6–33.0)
MCHC: 32.6 g/dL (ref 31.5–35.7)
MCV: 94 fL (ref 79–97)
Platelets: 311 10*3/uL (ref 150–450)
RBC: 4.39 x10E6/uL (ref 3.77–5.28)
RDW: 12.2 % — ABNORMAL LOW (ref 12.3–15.4)
WBC: 6.2 10*3/uL (ref 3.4–10.8)

## 2018-08-11 LAB — URINE CULTURE

## 2018-08-11 LAB — URINALYSIS, MICROSCOPIC ONLY: Casts: NONE SEEN /lpf

## 2018-08-11 LAB — VITAMIN D 25 HYDROXY (VIT D DEFICIENCY, FRACTURES): VIT D 25 HYDROXY: 32.8 ng/mL (ref 30.0–100.0)

## 2018-08-14 ENCOUNTER — Other Ambulatory Visit: Payer: Self-pay | Admitting: Obstetrics and Gynecology

## 2018-08-14 DIAGNOSIS — Z1231 Encounter for screening mammogram for malignant neoplasm of breast: Secondary | ICD-10-CM

## 2018-08-14 DIAGNOSIS — M8580 Other specified disorders of bone density and structure, unspecified site: Secondary | ICD-10-CM

## 2018-08-14 DIAGNOSIS — E2839 Other primary ovarian failure: Secondary | ICD-10-CM

## 2018-08-14 DIAGNOSIS — Z780 Asymptomatic menopausal state: Secondary | ICD-10-CM

## 2018-08-15 LAB — CYTOLOGY - PAP
Diagnosis: NEGATIVE
HPV: NOT DETECTED

## 2018-08-21 ENCOUNTER — Other Ambulatory Visit: Payer: Self-pay

## 2018-08-21 ENCOUNTER — Ambulatory Visit (INDEPENDENT_AMBULATORY_CARE_PROVIDER_SITE_OTHER): Payer: PRIVATE HEALTH INSURANCE | Admitting: *Deleted

## 2018-08-21 VITALS — BP 128/84 | HR 92 | Resp 14 | Ht 60.25 in | Wt 130.5 lb

## 2018-08-21 DIAGNOSIS — R8290 Unspecified abnormal findings in urine: Secondary | ICD-10-CM | POA: Diagnosis not present

## 2018-08-21 NOTE — Progress Notes (Signed)
Patient is here for a urine micro due to having blood in her previous urine micro on 08-10-18. Patient completed course of bactrim and denies any urinary symptoms today. Clean catch urine sent for micro. Patient aware will be updated once results are back and reviewed by Dr. Oscar LaJertson.

## 2018-08-22 LAB — URINALYSIS, MICROSCOPIC ONLY
BACTERIA UA: NONE SEEN
Casts: NONE SEEN /lpf

## 2018-08-23 ENCOUNTER — Other Ambulatory Visit: Payer: Self-pay | Admitting: Obstetrics and Gynecology

## 2018-08-23 DIAGNOSIS — R3129 Other microscopic hematuria: Secondary | ICD-10-CM

## 2018-10-12 ENCOUNTER — Other Ambulatory Visit: Payer: Self-pay

## 2018-10-12 ENCOUNTER — Telehealth: Payer: Self-pay | Admitting: Obstetrics and Gynecology

## 2018-10-12 ENCOUNTER — Other Ambulatory Visit: Payer: Self-pay | Admitting: Obstetrics and Gynecology

## 2018-10-12 DIAGNOSIS — M8580 Other specified disorders of bone density and structure, unspecified site: Secondary | ICD-10-CM

## 2018-10-12 DIAGNOSIS — Z780 Asymptomatic menopausal state: Secondary | ICD-10-CM

## 2018-10-12 DIAGNOSIS — E2839 Other primary ovarian failure: Secondary | ICD-10-CM

## 2018-10-12 NOTE — Telephone Encounter (Signed)
Routing to Dr. Edward Jolly for co-sign order.  Dr. Oscar La patient with need for bone density that is scheduled tomorrow.

## 2018-10-12 NOTE — Telephone Encounter (Signed)
[facility] called stating that the patient will be coming in tomorrow for a BMD and need the order to be signed.

## 2018-10-13 ENCOUNTER — Ambulatory Visit
Admission: RE | Admit: 2018-10-13 | Discharge: 2018-10-13 | Disposition: A | Discharge status: Home / Self Care | Payer: No Typology Code available for payment source | Source: Ambulatory Visit | Attending: Obstetrics and Gynecology | Admitting: Obstetrics and Gynecology

## 2018-10-13 ENCOUNTER — Other Ambulatory Visit: Payer: Self-pay | Admitting: Obstetrics and Gynecology

## 2018-10-13 DIAGNOSIS — M8580 Other specified disorders of bone density and structure, unspecified site: Secondary | ICD-10-CM

## 2018-10-13 DIAGNOSIS — Z780 Asymptomatic menopausal state: Secondary | ICD-10-CM

## 2018-10-13 DIAGNOSIS — Z1231 Encounter for screening mammogram for malignant neoplasm of breast: Secondary | ICD-10-CM

## 2018-10-13 DIAGNOSIS — E2839 Other primary ovarian failure: Secondary | ICD-10-CM

## 2018-10-13 NOTE — Telephone Encounter (Signed)
Order has been signed and encounter closed.

## 2018-10-25 NOTE — Progress Notes (Signed)
GYNECOLOGY  VISIT   HPI: 63 y.o.   Married White or Caucasian Not Hispanic or Latino  female   G2P1 with Patient's last menstrual period was 01/25/2005.  here for BMD consult. Recent DEXA with a T score of -2.8 in the right femur. She was on Fosamax and boniva in the past and had stomach upset, breast cysts, and kidney stones.  She had a normal CBC, CMP, and vit d in 11/19  GYNECOLOGIC HISTORY: Patient's last menstrual period was 01/25/2005. Contraception: Post menopausal, tubal ligation Menopausal hormone therapy: None        OB History    Gravida  2   Para  1   Term      Preterm      AB      Living  1     SAB      TAB      Ectopic      Multiple      Live Births                 Patient Active Problem List   Diagnosis Date Noted  . Diabetes mellitus screening 05/12/2015  . Screening for lipoid disorders 05/12/2015  . Acute hepatitis 03/04/2012  . Abdominal discomfort, epigastric 03/02/2012  . Transaminitis 03/02/2012    Past Medical History:  Diagnosis Date  . Allergy    seasonal  . Amenorrhea   . Anemia   . Glaucoma    trace  . Hematuria    negative work up  . Kidney stone   . No pertinent past medical history   . Osteopenia     Past Surgical History:  Procedure Laterality Date  . TUBAL LIGATION      Current Outpatient Medications  Medication Sig Dispense Refill  . fluticasone (FLONASE) 50 MCG/ACT nasal spray Place 2 sprays into both nostrils daily. 16 g 6   No current facility-administered medications for this visit.      ALLERGIES: Ciprofloxacin  Family History  Problem Relation Age of Onset  . Arthritis Mother   . Heart disease Father   . Diabetes Paternal Aunt   . Heart disease Paternal Aunt   . Hyperlipidemia Brother   . Uterine cancer Maternal Aunt   . Ovarian cancer Maternal Aunt   . Heart disease Paternal Uncle     Social History   Socioeconomic History  . Marital status: Married    Spouse name: Not on file  .  Number of children: Not on file  . Years of education: Not on file  . Highest education level: Not on file  Occupational History  . Not on file  Social Needs  . Financial resource strain: Not on file  . Food insecurity:    Worry: Not on file    Inability: Not on file  . Transportation needs:    Medical: Not on file    Non-medical: Not on file  Tobacco Use  . Smoking status: Never Smoker  . Smokeless tobacco: Never Used  Substance and Sexual Activity  . Alcohol use: Yes    Alcohol/week: 1.0 standard drinks    Types: 1 Standard drinks or equivalent per week  . Drug use: No  . Sexual activity: Yes    Partners: Male    Birth control/protection: Surgical    Comment: tubal ligation  Lifestyle  . Physical activity:    Days per week: Not on file    Minutes per session: Not on file  . Stress: Not on file  Relationships  . Social connections:    Talks on phone: Not on file    Gets together: Not on file    Attends religious service: Not on file    Active member of club or organization: Not on file    Attends meetings of clubs or organizations: Not on file    Relationship status: Not on file  . Intimate partner violence:    Fear of current or ex partner: Not on file    Emotionally abused: Not on file    Physically abused: Not on file    Forced sexual activity: Not on file  Other Topics Concern  . Not on file  Social History Narrative  . Not on file    Review of Systems  Constitutional: Negative.   HENT: Negative.   Eyes: Negative.   Respiratory: Negative.   Cardiovascular: Negative.   Gastrointestinal: Negative.   Genitourinary: Negative.   Musculoskeletal: Negative.   Skin: Negative.   Neurological: Negative.   Endo/Heme/Allergies: Negative.   Psychiatric/Behavioral: Negative.     PHYSICAL EXAMINATION:    BP 104/64 (BP Location: Right Arm, Patient Position: Sitting, Cuff Size: Normal)   Pulse 84   Wt 130 lb (59 kg)   LMP 01/25/2005   BMI 25.18 kg/m      General appearance: alert, cooperative and appears stated age  ASSESSMENT Osteoporosis, previously didn't tolerate boniva or fosomax previously. She had normal lab work in 11/19, vit d was just in the normal range    PLAN Discussed calcium 1,200 mg a day Vit d 800-1,000 IU a day and exercise Discussed trying to avoid situations with a high risk of falling Given her bad reaction to the boniva and fosamax she is not interested in the IV biphosphonates.  Discussed prolia and evista including risks/side effects Reading information given The patient is going to consider her options and get back to me Will plan another DEXA in 2 years   An After Visit Summary was printed and given to the patient.  ~15 minutes face to face time of which over 50% was spent in counseling.

## 2018-10-26 ENCOUNTER — Other Ambulatory Visit: Payer: Self-pay

## 2018-10-26 ENCOUNTER — Encounter: Payer: Self-pay | Admitting: Obstetrics and Gynecology

## 2018-10-26 ENCOUNTER — Ambulatory Visit (INDEPENDENT_AMBULATORY_CARE_PROVIDER_SITE_OTHER): Payer: PRIVATE HEALTH INSURANCE | Admitting: Obstetrics and Gynecology

## 2018-10-26 VITALS — BP 104/64 | HR 84 | Wt 130.0 lb

## 2018-10-26 DIAGNOSIS — M810 Age-related osteoporosis without current pathological fracture: Secondary | ICD-10-CM | POA: Diagnosis not present

## 2018-10-26 NOTE — Patient Instructions (Signed)
1,200 mg of calcium between diet and supplement 800-1,000 IU of vit d daily Exercise.   Osteoporosis  Osteoporosis is thinning and loss of density in your bones. Osteoporosis makes bones more brittle and fragile and more likely to break (fracture). Over time, osteoporosis can cause your bones to become so weak that they fracture after a minor fall. Bones in the hip, wrist, and spine are most likely to fracture due to osteoporosis. What are the causes? The exact cause of this condition is not known. What increases the risk? You may be at greater risk for osteoporosis if you:  Have a family history of the condition.  Have poor nutrition.  Use steroid medicines, such as prednisone.  Are female.  Are age 63 or older.  Smoke or have a history of smoking.  Are not physically active (are sedentary).  Are white (Caucasian) or of Asian descent.  Have a small body frame.  Take certain medicines, such as antiseizure medicines. What are the signs or symptoms? A fracture might be the first sign of osteoporosis, especially if the fracture results from a fall or injury that usually would not cause a bone to break. Other signs and symptoms include:  Pain in the neck or low back.  Stooped posture.  Loss of height. How is this diagnosed? This condition may be diagnosed based on:  Your medical history.  A physical exam.  A bone mineral density test, also called a DXA or DEXA test (dual-energy X-ray absorptiometry test). This test uses X-rays to measure the amount of minerals in your bones. How is this treated? The goal of treatment is to strengthen your bones and lower your risk for a fracture. Treatment may involve:  Making lifestyle changes, such as: ? Including foods with more calcium and vitamin D in your diet. ? Doing weight-bearing and muscle-strengthening exercises. ? Stopping tobacco use. ? Limiting alcohol intake.  Taking medicine to slow the process of bone loss or to  increase bone density.  Taking daily supplements of calcium and vitamin D.  Taking hormone replacement medicines, such as estrogen for women and testosterone for men.  Monitoring your levels of calcium and vitamin D. Follow these instructions at home:  Activity  Exercise as told by your health care provider. Ask your health care provider what exercises and activities are safe for you. You should do: ? Exercises that make you work against gravity (weight-bearing exercises), such as tai chi, yoga, or walking. ? Exercises to strengthen muscles, such as lifting weights. Lifestyle  Limit alcohol intake to no more than 1 drink a day for nonpregnant women and 2 drinks a day for men. One drink equals 12 oz of beer, 5 oz of wine, or 1 oz of hard liquor.  Do not use any products that contain nicotine or tobacco, such as cigarettes and e-cigarettes. If you need help quitting, ask your health care provider. Preventing falls  Use devices to help you move around (mobility aids) as needed, such as canes, walkers, scooters, or crutches.  Keep rooms well-lit and clutter-free.  Remove tripping hazards from walkways, including cords and throw rugs.  Install grab bars in bathrooms and safety rails on stairs.  Use rubber mats in the bathroom and other areas that are often wet or slippery.  Wear closed-toe shoes that fit well and support your feet. Wear shoes that have rubber soles or low heels.  Review your medicines with your health care provider. Some medicines can cause dizziness or changes in blood  pressure, which can increase your risk of falling. General instructions  Include calcium and vitamin D in your diet. Calcium is important for bone health, and vitamin D helps your body to absorb calcium. Good sources of calcium and vitamin D include: ? Certain fatty fish, such as salmon and tuna. ? Products that have calcium and vitamin D added to them (fortified products), such as fortified  cereals. ? Egg yolks. ? Cheese. ? Liver.  Take over-the-counter and prescription medicines only as told by your health care provider.  Keep all follow-up visits as told by your health care provider. This is important. Contact a health care provider if:  You have never been screened for osteoporosis and you are: ? A woman who is age 48 or older. ? A man who is age 47 or older. Get help right away if:  You fall or injure yourself. Summary  Osteoporosis is thinning and loss of density in your bones. This makes bones more brittle and fragile and more likely to break (fracture),even with minor falls.  The goal of treatment is to strengthen your bones and reduce your risk for a fracture.  Include calcium and vitamin D in your diet. Calcium is important for bone health, and vitamin D helps your body to absorb calcium.  Talk with your health care provider about screening for osteoporosis if you are a woman who is age 16 or older, or a man who is age 40 or older. This information is not intended to replace advice given to you by your health care provider. Make sure you discuss any questions you have with your health care provider. Document Released: 06/23/2005 Document Revised: 07/08/2017 Document Reviewed: 07/08/2017 Elsevier Interactive Patient Education  2019 [employer].

## 2019-08-20 NOTE — Progress Notes (Signed)
63 y.o. G2P1 Married White or Caucasian Not Hispanic or Latino female here for annual exam.  No concerns. Sexually active, no pain. No vaginal bleeding.  She has osteoporosis, hasn't tolerated fosamax or boniva. She declined other treatment at the moment. She is taking calcium and vit d, exercising some. She is willing to reconsider medication in the future.     Patient's last menstrual period was 01/25/2005.          Sexually active: Yes.    The current method of family planning is tubal ligation.    Exercising: No.  The patient does not participate in regular exercise at present. Smoker:  no  Health Maintenance: Pap:  08/10/2018 WNL NEG HPV, 06/01/2013 WNL History of abnormal Pap:  no MMG: 10/13/2018 Birads 1 negative BMD:   10/13/2018 osteoporosis Colonoscopy: 2011 WNL TDaP:   Out of date, would like one today Gardasil: N/A   reports that she has never smoked. She has never used smokeless tobacco. She reports current alcohol use of about 1.0 standard drinks of alcohol per week. She reports that she does not use drugs. 1 daughter and 2 grandchildren (local). [place] kids are 70 and 21  Past Medical History:  Diagnosis Date  . Allergy    seasonal  . Amenorrhea   . Anemia   . Glaucoma    trace  . Hematuria    negative work up  . Kidney stone   . No pertinent past medical history   . Osteopenia     Past Surgical History:  Procedure Laterality Date  . TUBAL LIGATION      Current Outpatient Medications  Medication Sig Dispense Refill  . fluticasone (FLONASE) 50 MCG/ACT nasal spray Place 2 sprays into both nostrils daily. 16 g 6  . Multiple Vitamin (MULTIVITAMIN PO) Take by mouth.     No current facility-administered medications for this visit.     Family History  Problem Relation Age of Onset  . Arthritis Mother   . Heart disease Father   . Diabetes Paternal Aunt   . Heart disease Paternal Aunt   . Hyperlipidemia Brother   . Uterine cancer Maternal Aunt   . Ovarian  cancer Maternal Aunt   . Heart disease Paternal Uncle     Review of Systems  Constitutional: Negative.   HENT: Negative.   Eyes: Negative.   Respiratory: Negative.   Cardiovascular: Negative.   Gastrointestinal: Negative.   Endocrine: Negative.   Genitourinary: Negative.   Musculoskeletal: Negative.   Skin: Negative.   Allergic/Immunologic: Negative.   Neurological: Negative.   Hematological: Negative.   Psychiatric/Behavioral: Negative.     Exam:   BP 108/70 (BP Location: Right Arm, Patient Position: Sitting, Cuff Size: Normal)   Pulse 80   Temp (!) 97.1 F (36.2 C) (Skin)   Ht 5' 0.24" (1.53 m)   Wt 135 lb (61.2 kg)   LMP 01/25/2005   BMI 26.16 kg/m   Weight change: @WEIGHTCHANGE @ Height:   Height: 5' 0.24" (153 cm)  Ht Readings from Last 3 Encounters:  08/22/19 5' 0.24" (1.53 m)  08/21/18 5' 0.25" (1.53 m)  08/10/18 5' 0.24" (1.53 m)    General appearance: alert, cooperative and appears stated age Head: Normocephalic, without obvious abnormality, atraumatic Neck: no adenopathy, supple, symmetrical, trachea midline and thyroid normal to inspection and palpation Lungs: clear to auscultation bilaterally Cardiovascular: regular rate and rhythm Breasts: normal appearance, no masses or tenderness Abdomen: soft, non-tender; non distended,  no masses,  no organomegaly Extremities:  extremities normal, atraumatic, no cyanosis or edema Skin: Skin color, texture, turgor normal. No rashes or lesions Lymph nodes: Cervical, supraclavicular, and axillary nodes normal. No abnormal inguinal nodes palpated Neurologic: Grossly normal   Pelvic: External genitalia:  no lesions              Urethra:  normal appearing urethra with no masses, tenderness or lesions              Bartholins and Skenes: normal                 Vagina: mildly atrophic appearing vagina with normal color and discharge, no lesions              Cervix: no lesions               Bimanual Exam:  Uterus:  normal  size, contour, position, consistency, mobility, non-tender              Adnexa: no mass, fullness, tenderness               Rectovaginal: Confirms               Anus:  normal sphincter tone, no lesions  Chaperone was present for exam.  A:  Well Woman with normal exam  Osteoporosis, previously hasn't tolerated Biphosphonates.   H/O vit d def  P:   Fasting labs, vit D  No pap this year  Mammogram in 1/21  Colonoscopy next year  DEXA in 1/22  Discussed breast self exam  Discussed calcium and vit D intake

## 2019-08-21 ENCOUNTER — Other Ambulatory Visit: Payer: Self-pay

## 2019-08-22 ENCOUNTER — Encounter: Payer: Self-pay | Admitting: Obstetrics and Gynecology

## 2019-08-22 ENCOUNTER — Ambulatory Visit (INDEPENDENT_AMBULATORY_CARE_PROVIDER_SITE_OTHER): Payer: Commercial Managed Care - PPO | Admitting: Obstetrics and Gynecology

## 2019-08-22 VITALS — BP 108/70 | HR 80 | Temp 97.1°F | Ht 60.24 in | Wt 135.0 lb

## 2019-08-22 DIAGNOSIS — Z01419 Encounter for gynecological examination (general) (routine) without abnormal findings: Secondary | ICD-10-CM | POA: Diagnosis not present

## 2019-08-22 DIAGNOSIS — M810 Age-related osteoporosis without current pathological fracture: Secondary | ICD-10-CM | POA: Diagnosis not present

## 2019-08-22 DIAGNOSIS — Z0000 Encounter for general adult medical examination without abnormal findings: Secondary | ICD-10-CM

## 2019-08-22 DIAGNOSIS — Z23 Encounter for immunization: Secondary | ICD-10-CM | POA: Diagnosis not present

## 2019-08-22 DIAGNOSIS — E559 Vitamin D deficiency, unspecified: Secondary | ICD-10-CM

## 2019-08-22 NOTE — Patient Instructions (Signed)
EXERCISE AND DIET:  We recommended that you start or continue a regular exercise program for good health. Regular exercise means any activity that makes your heart beat faster and makes you sweat.  We recommend exercising at least 30 minutes per day at least 3 days a week, preferably 4 or 5.  We also recommend a diet low in fat and sugar.  Inactivity, poor dietary choices and obesity can cause diabetes, heart attack, stroke, and kidney damage, among others.   ° °ALCOHOL AND SMOKING:  Women should limit their alcohol intake to no more than 7 drinks/beers/glasses of wine (combined, not each!) per week. Moderation of alcohol intake to this level decreases your risk of breast cancer and liver damage. And of course, no recreational drugs are part of a healthy lifestyle.  And absolutely no smoking or even second hand smoke. Most people know smoking can cause heart and lung diseases, but did you know it also contributes to weakening of your bones? Aging of your skin?  Yellowing of your teeth and nails? ° °CALCIUM AND VITAMIN D:  Adequate intake of calcium and Vitamin D are recommended.  The recommendations for exact amounts of these supplements seem to change often, but generally speaking 1,200 mg of calcium (between diet and supplement) and 800 units of Vitamin D per day seems prudent. Certain women may benefit from higher intake of Vitamin D.  If you are among these women, your doctor will have told you during your visit.   ° °PAP SMEARS:  Pap smears, to check for cervical cancer or precancers,  have traditionally been done yearly, although recent scientific advances have shown that most women can have pap smears less often.  However, every woman still should have a physical exam from her gynecologist every year. It will include a breast check, inspection of the vulva and vagina to check for abnormal growths or skin changes, a visual exam of the cervix, and then an exam to evaluate the size and shape of the uterus and  ovaries.  And after 63 years of age, a rectal exam is indicated to check for rectal cancers. We will also provide age appropriate advice regarding health maintenance, like when you should have certain vaccines, screening for sexually transmitted diseases, bone density testing, colonoscopy, mammograms, etc.  ° °MAMMOGRAMS:  All women over 40 years old should have a yearly mammogram. Many facilities now offer a "3D" mammogram, which may cost around $50 extra out of pocket. If possible,  we recommend you accept the option to have the 3D mammogram performed.  It both reduces the number of women who will be called back for extra views which then turn out to be normal, and it is better than the routine mammogram at detecting truly abnormal areas.   ° °COLON CANCER SCREENING: Now recommend starting at age 45. At this time colonoscopy is not covered for routine screening until 50. There are take home tests that can be done between 45-49.  ° °COLONOSCOPY:  Colonoscopy to screen for colon cancer is recommended for all women at age 50.  We know, you hate the idea of the prep.  We agree, BUT, having colon cancer and not knowing it is worse!!  Colon cancer so often starts as a polyp that can be seen and removed at colonscopy, which can quite literally save your life!  And if your first colonoscopy is normal and you have no family history of colon cancer, most women don't have to have it again for   10 years.  Once every ten years, you can do something that may end up saving your life, right?  We will be happy to help you get it scheduled when you are ready.  Be sure to check your insurance coverage so you understand how much it will cost.  It may be covered as a preventative service at no cost, but you should check your particular policy.   ° ° ° °Breast Self-Awareness °Breast self-awareness means being familiar with how your breasts look and feel. It involves checking your breasts regularly and reporting any changes to your  health care provider. °Practicing breast self-awareness is important. A change in your breasts can be a sign of a serious medical problem. Being familiar with how your breasts look and feel allows you to find any problems early, when treatment is more likely to be successful. All women should practice breast self-awareness, including women who have had breast implants. °How to do a breast self-exam °One way to learn what is normal for your breasts and whether your breasts are changing is to do a breast self-exam. To do a breast self-exam: °Look for Changes ° °1. Remove all the clothing above your waist. °2. Stand in front of a mirror in a room with good lighting. °3. Put your hands on your hips. °4. Push your hands firmly downward. °5. Compare your breasts in the mirror. Look for differences between them (asymmetry), such as: °? Differences in shape. °? Differences in size. °? Puckers, dips, and bumps in one breast and not the other. °6. Look at each breast for changes in your skin, such as: °? Redness. °? Scaly areas. °7. Look for changes in your nipples, such as: °? Discharge. °? Bleeding. °? Dimpling. °? Redness. °? A change in position. °Feel for Changes °Carefully feel your breasts for lumps and changes. It is best to do this while lying on your back on the floor and again while sitting or standing in the shower or tub with soapy water on your skin. Feel each breast in the following way: °· Place the arm on the side of the breast you are examining above your head. °· Feel your breast with the other hand. °· Start in the nipple area and make ¾ inch (2 cm) overlapping circles to feel your breast. Use the pads of your three middle fingers to do this. Apply light pressure, then medium pressure, then firm pressure. The light pressure will allow you to feel the tissue closest to the skin. The medium pressure will allow you to feel the tissue that is a little deeper. The firm pressure will allow you to feel the tissue  close to the ribs. °· Continue the overlapping circles, moving downward over the breast until you feel your ribs below your breast. °· Move one finger-width toward the center of the body. Continue to use the ¾ inch (2 cm) overlapping circles to feel your breast as you move slowly up toward your collarbone. °· Continue the up and down exam using all three pressures until you reach your armpit. ° °Write Down What You Find ° °Write down what is normal for each breast and any changes that you find. Keep a written record with breast changes or normal findings for each breast. By writing this information down, you do not need to depend only on memory for size, tenderness, or location. Write down where you are in your menstrual cycle, if you are still menstruating. °If you are having trouble noticing differences   in your breasts, do not get discouraged. With time you will become more familiar with the variations in your breasts and more comfortable with the exam. How often should I examine my breasts? Examine your breasts every month. If you are breastfeeding, the best time to examine your breasts is after a feeding or after using a breast pump. If you menstruate, the best time to examine your breasts is 5-7 days after your period is over. During your period, your breasts are lumpier, and it may be more difficult to notice changes. When should I see my health care provider? See your health care provider if you notice:  A change in shape or size of your breasts or nipples.  A change in the skin of your breast or nipples, such as a reddened or scaly area.  Unusual discharge from your nipples.  A lump or thick area that was not there before.  Pain in your breasts.  Anything that concerns you.   Osteoporosis  Osteoporosis is thinning and loss of density in your bones. Osteoporosis makes bones more brittle and fragile and more likely to break (fracture). Over time, osteoporosis can cause your bones to become  so weak that they fracture after a minor fall. Bones in the hip, wrist, and spine are most likely to fracture due to osteoporosis. What are the causes? The exact cause of this condition is not known. What increases the risk? You may be at greater risk for osteoporosis if you:  Have a family history of the condition.  Have poor nutrition.  Use steroid medicines, such as prednisone.  Are female.  Are age 85 or older.  Smoke or have a history of smoking.  Are not physically active (are sedentary).  Are white (Caucasian) or of Asian descent.  Have a small body frame.  Take certain medicines, such as antiseizure medicines. What are the signs or symptoms? A fracture might be the first sign of osteoporosis, especially if the fracture results from a fall or injury that usually would not cause a bone to break. Other signs and symptoms include:  Pain in the neck or low back.  Stooped posture.  Loss of height. How is this diagnosed? This condition may be diagnosed based on:  Your medical history.  A physical exam.  A bone mineral density test, also called a DXA or DEXA test (dual-energy X-ray absorptiometry test). This test uses X-rays to measure the amount of minerals in your bones. How is this treated? The goal of treatment is to strengthen your bones and lower your risk for a fracture. Treatment may involve:  Making lifestyle changes, such as: ? Including foods with more calcium and vitamin D in your diet. ? Doing weight-bearing and muscle-strengthening exercises. ? Stopping tobacco use. ? Limiting alcohol intake.  Taking medicine to slow the process of bone loss or to increase bone density.  Taking daily supplements of calcium and vitamin D.  Taking hormone replacement medicines, such as estrogen for women and testosterone for men.  Monitoring your levels of calcium and vitamin D. Follow these instructions at home:  Activity  Exercise as told by your health care  provider. Ask your health care provider what exercises and activities are safe for you. You should do: ? Exercises that make you work against gravity (weight-bearing exercises), such as tai chi, yoga, or walking. ? Exercises to strengthen muscles, such as lifting weights. Lifestyle  Limit alcohol intake to no more than 1 drink a day for nonpregnant women and  2 drinks a day for men. One drink equals 12 oz of beer, 5 oz of wine, or 1 oz of hard liquor.  Do not use any products that contain nicotine or tobacco, such as cigarettes and e-cigarettes. If you need help quitting, ask your health care provider. Preventing falls  Use devices to help you move around (mobility aids) as needed, such as canes, walkers, scooters, or crutches.  Keep rooms well-lit and clutter-free.  Remove tripping hazards from walkways, including cords and throw rugs.  Install grab bars in bathrooms and safety rails on stairs.  Use rubber mats in the bathroom and other areas that are often wet or slippery.  Wear closed-toe shoes that fit well and support your feet. Wear shoes that have rubber soles or low heels.  Review your medicines with your health care provider. Some medicines can cause dizziness or changes in blood pressure, which can increase your risk of falling. General instructions  Include calcium and vitamin D in your diet. Calcium is important for bone health, and vitamin D helps your body to absorb calcium. Good sources of calcium and vitamin D include: ? Certain fatty fish, such as salmon and tuna. ? Products that have calcium and vitamin D added to them (fortified products), such as fortified cereals. ? Egg yolks. ? Cheese. ? Liver.  Take over-the-counter and prescription medicines only as told by your health care provider.  Keep all follow-up visits as told by your health care provider. This is important. Contact a health care provider if:  You have never been screened for osteoporosis and you  are: ? A woman who is age 50 or older. ? A man who is age 48 or older. Get help right away if:  You fall or injure yourself. Summary  Osteoporosis is thinning and loss of density in your bones. This makes bones more brittle and fragile and more likely to break (fracture),even with minor falls.  The goal of treatment is to strengthen your bones and reduce your risk for a fracture.  Include calcium and vitamin D in your diet. Calcium is important for bone health, and vitamin D helps your body to absorb calcium.  Talk with your health care provider about screening for osteoporosis if you are a woman who is age 10 or older, or a man who is age 12 or older. This information is not intended to replace advice given to you by your health care provider. Make sure you discuss any questions you have with your health care provider. Document Released: 06/23/2005 Document Revised: 08/26/2017 Document Reviewed: 07/08/2017 Elsevier Patient Education  2020 [employer].

## 2019-08-23 LAB — COMPREHENSIVE METABOLIC PANEL
ALT: 16 IU/L (ref 0–32)
AST: 16 IU/L (ref 0–40)
Albumin/Globulin Ratio: 1.8 (ref 1.2–2.2)
Albumin: 4.3 g/dL (ref 3.8–4.8)
Alkaline Phosphatase: 78 IU/L (ref 39–117)
BUN/Creatinine Ratio: 14 (ref 12–28)
BUN: 10 mg/dL (ref 8–27)
Bilirubin Total: 0.3 mg/dL (ref 0.0–1.2)
CO2: 27 mmol/L (ref 20–29)
Calcium: 9.2 mg/dL (ref 8.7–10.3)
Chloride: 104 mmol/L (ref 96–106)
Creatinine, Ser: 0.72 mg/dL (ref 0.57–1.00)
GFR calc Af Amer: 103 mL/min/{1.73_m2} (ref >59)
GFR calc non Af Amer: 89 mL/min/{1.73_m2} (ref >59)
Globulin, Total: 2.4 g/dL (ref 1.5–4.5)
Glucose: 83 mg/dL (ref 65–99)
Potassium: 4.4 mmol/L (ref 3.5–5.2)
Sodium: 141 mmol/L (ref 134–144)
Total Protein: 6.7 g/dL (ref 6.0–8.5)

## 2019-08-23 LAB — CBC
Hematocrit: 40.9 % (ref 34.0–46.6)
Hemoglobin: 13 g/dL (ref 11.1–15.9)
MCH: 30.3 pg (ref 26.6–33.0)
MCHC: 31.8 g/dL (ref 31.5–35.7)
MCV: 95 fL (ref 79–97)
Platelets: 307 10*3/uL (ref 150–450)
RBC: 4.29 x10E6/uL (ref 3.77–5.28)
RDW: 12.2 % (ref 11.7–15.4)
WBC: 6.6 10*3/uL (ref 3.4–10.8)

## 2019-08-23 LAB — LIPID PANEL
Chol/HDL Ratio: 3.4 ratio (ref 0.0–4.4)
Cholesterol, Total: 226 mg/dL — ABNORMAL HIGH (ref 100–199)
HDL: 67 mg/dL (ref >39)
LDL Chol Calc (NIH): 139 mg/dL — ABNORMAL HIGH (ref 0–99)
Triglycerides: 116 mg/dL (ref 0–149)
VLDL Cholesterol Cal: 20 mg/dL (ref 5–40)

## 2019-08-23 LAB — VITAMIN D 25 HYDROXY (VIT D DEFICIENCY, FRACTURES): Vit D, 25-Hydroxy: 30.8 ng/mL (ref 30.0–100.0)

## 2019-10-19 ENCOUNTER — Other Ambulatory Visit: Payer: Self-pay | Admitting: Obstetrics and Gynecology

## 2019-10-19 DIAGNOSIS — Z1231 Encounter for screening mammogram for malignant neoplasm of breast: Secondary | ICD-10-CM

## 2019-11-27 ENCOUNTER — Other Ambulatory Visit: Payer: Self-pay

## 2019-11-27 ENCOUNTER — Ambulatory Visit
Admission: RE | Admit: 2019-11-27 | Discharge: 2019-11-27 | Disposition: A | Discharge status: Home / Self Care | Payer: Commercial Managed Care - PPO | Source: Ambulatory Visit | Attending: Obstetrics and Gynecology | Admitting: Obstetrics and Gynecology

## 2019-11-27 DIAGNOSIS — Z1231 Encounter for screening mammogram for malignant neoplasm of breast: Secondary | ICD-10-CM

## 2020-08-18 NOTE — Progress Notes (Signed)
64 y.o. G2P1 Married White or Caucasian Not Hispanic or Latino female here for annual exam.  Sexually active, no pain. No bowel or bladder c/o. No vaginal bleeding.   Patient has had pain in her left ear.     She has a h/o osteoporosis, hasn't tolerated fosamax or boniva.   Patient's last menstrual period was 01/25/2005.          Sexually active: Yes.    The current method of family planning is tubal ligation.    Exercising: Yes.    walking Smoker:  no  Health Maintenance: Pap:  08/10/2018 WNL NEG HPV, 06/01/2013 WNL History of abnormal Pap:  no MMG:  11/28/19 density C Bi-rads 1 neg  BMD:   10/13/2018 osteoporosis Colonoscopy: 05/29/2010 WNL   TDaP:  08/22/19  Gardasil: NA   reports that she has never smoked. She has never used smokeless tobacco. She reports current alcohol use of about 1.0 standard drink of alcohol per week. She reports that she does not use drugs.  1 daughter and 2 grandchildren (local). Grand kids are 7 and 22. Granddaughter is a Printmaker at [employer]. Lucila Maine is married and expecting a baby girl next month.   Past Medical History:  Diagnosis Date  . Allergy    seasonal  . Amenorrhea   . Anemia   . Glaucoma    trace  . Hematuria    negative work up  . Kidney stone   . No pertinent past medical history   . Osteopenia     Past Surgical History:  Procedure Laterality Date  . TUBAL LIGATION      Current Outpatient Medications  Medication Sig Dispense Refill  . fluticasone (FLONASE) 50 MCG/ACT nasal spray Place 2 sprays into both nostrils daily. 16 g 6  . Multiple Vitamin (MULTIVITAMIN PO) Take by mouth.     No current facility-administered medications for this visit.    Family History  Problem Relation Age of Onset  . Arthritis Mother   . Heart disease Father   . Diabetes Paternal Aunt   . Heart disease Paternal Aunt   . Hyperlipidemia Brother   . Uterine cancer Maternal Aunt   . Ovarian cancer Maternal Aunt   . Heart disease Paternal Uncle      Review of Systems  HENT: Positive for ear pain.   All other systems reviewed and are negative.   Exam:   BP 118/72 (BP Location: Left Arm, Patient Position: Sitting, Cuff Size: Normal)   Pulse 88   Ht 5' (1.524 m)   Wt 131 lb 12.8 oz (59.8 kg)   LMP 01/25/2005   SpO2 100%   BMI 25.74 kg/m   Weight change: @WEIGHTCHANGE @ Height:   Height: 5' (152.4 cm)  Ht Readings from Last 3 Encounters:  08/25/20 5' (1.524 m)  08/22/19 5' 0.24" (1.53 m)  08/21/18 5' 0.25" (1.53 m)    General appearance: alert, cooperative and appears stated age Head: Normocephalic, without obvious abnormality, atraumatic Neck: no adenopathy, supple, symmetrical, trachea midline and thyroid normal to inspection and palpation Lungs: clear to auscultation bilaterally Cardiovascular: regular rate and rhythm Breasts: normal appearance, no masses or tenderness Abdomen: soft, non-tender; non distended,  no masses,  no organomegaly Extremities: extremities normal, atraumatic, no cyanosis or edema Skin: Skin color, texture, turgor normal. No rashes or lesions Lymph nodes: Cervical, supraclavicular, and axillary nodes normal. No abnormal inguinal nodes palpated Neurologic: Grossly normal   Pelvic: External genitalia:  no lesions  Urethra:  normal appearing urethra with no masses, tenderness or lesions              Bartholins and Skenes: normal                 Vagina: mildly atrophic appearing vagina with normal color and discharge, no lesions              Cervix: no lesions               Bimanual Exam:  Uterus:  normal size, contour, position, consistency, mobility, non-tender              Adnexa: no mass, fullness, tenderness               Rectovaginal: Confirms               Anus:  normal sphincter tone, no lesions  Shanon Petty chaperoned for the exam.  A:  Well Woman with normal exam  H/O osteoporosis, hasn't tolerated oral biphosphonates  H/O vit d def   P:   No pap this  year  Mammogram in 3/22  DEXA in 1/22  Colonoscopy due  Discussed breast self exam  Discussed calcium and vit D intake

## 2020-08-25 ENCOUNTER — Encounter: Payer: Self-pay | Admitting: Obstetrics and Gynecology

## 2020-08-25 ENCOUNTER — Ambulatory Visit (INDEPENDENT_AMBULATORY_CARE_PROVIDER_SITE_OTHER): Payer: Commercial Managed Care - PPO | Admitting: Obstetrics and Gynecology

## 2020-08-25 ENCOUNTER — Other Ambulatory Visit: Payer: Self-pay

## 2020-08-25 VITALS — BP 118/72 | HR 88 | Ht 60.0 in | Wt 131.8 lb

## 2020-08-25 DIAGNOSIS — Z01419 Encounter for gynecological examination (general) (routine) without abnormal findings: Secondary | ICD-10-CM | POA: Diagnosis not present

## 2020-08-25 DIAGNOSIS — M810 Age-related osteoporosis without current pathological fracture: Secondary | ICD-10-CM | POA: Diagnosis not present

## 2020-08-25 DIAGNOSIS — Z0000 Encounter for general adult medical examination without abnormal findings: Secondary | ICD-10-CM

## 2020-08-25 DIAGNOSIS — E7800 Pure hypercholesterolemia, unspecified: Secondary | ICD-10-CM

## 2020-08-25 DIAGNOSIS — E559 Vitamin D deficiency, unspecified: Secondary | ICD-10-CM

## 2020-08-25 NOTE — Patient Instructions (Signed)
EXERCISE   We recommended that you start or continue a regular exercise program for good health. Physical activity is anything that gets your body moving, some is better than none. The CDC recommends 150 minutes per week of Moderate-Intensity Aerobic Activity and 2 or more days of Muscle Strengthening Activity.  Benefits of exercise are limitless: helps weight loss/weight maintenance, improves mood and energy, helps with depression and anxiety, improves sleep, tones and strengthens muscles, improves balance, improves bone density, protects from chronic conditions such as heart disease, high blood pressure and diabetes and so much more. To learn more visit: https://www.cdc.gov/physicalactivity/index.html  DIET: Good nutrition starts with a healthy diet of fruits, vegetables, whole grains, and lean protein sources. Drink plenty of water for hydration. Minimize empty calories, sodium, sweets. For more information about dietary recommendations visit: https://health.gov/our-work/nutrition-physical-activity/dietary-guidelines and https://www.myplate.gov/  ALCOHOL:  Women should limit their alcohol intake to no more than 7 drinks/beers/glasses of wine (combined, not each!) per week. Moderation of alcohol intake to this level decreases your risk of breast cancer and liver damage.  If you are concerned that you may have a problem, or your friends have told you they are concerned about your drinking, there are many resources to help. A well-known program that is free, effective, and available to all people all over the nation is Alcoholics Anonymous.  Check out this site to learn more: https://www.aa.org/   CALCIUM AND VITAMIN D:  Adequate intake of calcium and Vitamin D are recommended for bone health.  The recommendations for exact amounts of these supplements seem to change often, but generally speaking 1000-1500 mg of calcium (between diet and supplement) and 800 units of Vitamin D per day seems prudent.      PAP SMEARS:  Pap smears, to check for cervical cancer or precancers,  have traditionally been done yearly, although recent scientific advances have shown that most women can have pap smears less often.  However, every woman still should have a physical exam from her gynecologist every year. It will include a breast check, inspection of the vulva and vagina to check for abnormal growths or skin changes, a visual exam of the cervix, and then an exam to evaluate the size and shape of the uterus and ovaries.  And after 64 years of age, a rectal exam is indicated to check for rectal cancers. We will also provide age appropriate advice regarding health maintenance, like when you should have certain vaccines, screening for sexually transmitted diseases, bone density testing, colonoscopy, mammograms, etc.   MAMMOGRAMS:  All women over 40 years old should have a routine mammogram.   COLON CANCER SCREENING: Now recommend starting at age 45. At this time colonoscopy is not covered for routine screening until 50. There are take home tests that can be done between 45-49.   COLONOSCOPY:  Colonoscopy to screen for colon cancer is recommended for all women at age 50.  We know, you hate the idea of the prep.  We agree, BUT, having colon cancer and not knowing it is worse!!  Colon cancer so often starts as a polyp that can be seen and removed at colonscopy, which can quite literally save your life!  And if your first colonoscopy is normal and you have no family history of colon cancer, most women don't have to have it again for 10 years.  Once every ten years, you can do something that may end up saving your life, right?  We will be happy to help you get it scheduled when   you are ready.  Be sure to check your insurance coverage so you understand how much it will cost.  It may be covered as a preventative service at no cost, but you should check your particular policy.      Breast Self-Awareness Breast self-awareness  means being familiar with how your breasts look and feel. It involves checking your breasts regularly and reporting any changes to your health care provider. Practicing breast self-awareness is important. A change in your breasts can be a sign of a serious medical problem. Being familiar with how your breasts look and feel allows you to find any problems early, when treatment is more likely to be successful. All women should practice breast self-awareness, including women who have had breast implants. How to do a breast self-exam One way to learn what is normal for your breasts and whether your breasts are changing is to do a breast self-exam. To do a breast self-exam: Look for Changes  1. Remove all the clothing above your waist. 2. Stand in front of a mirror in a room with good lighting. 3. Put your hands on your hips. 4. Push your hands firmly downward. 5. Compare your breasts in the mirror. Look for differences between them (asymmetry), such as: ? Differences in shape. ? Differences in size. ? Puckers, dips, and bumps in one breast and not the other. 6. Look at each breast for changes in your skin, such as: ? Redness. ? Scaly areas. 7. Look for changes in your nipples, such as: ? Discharge. ? Bleeding. ? Dimpling. ? Redness. ? A change in position. Feel for Changes Carefully feel your breasts for lumps and changes. It is best to do this while lying on your back on the floor and again while sitting or standing in the shower or tub with soapy water on your skin. Feel each breast in the following way:  Place the arm on the side of the breast you are examining above your head.  Feel your breast with the other hand.  Start in the nipple area and make  inch (2 cm) overlapping circles to feel your breast. Use the pads of your three middle fingers to do this. Apply light pressure, then medium pressure, then firm pressure. The light pressure will allow you to feel the tissue closest to the  skin. The medium pressure will allow you to feel the tissue that is a little deeper. The firm pressure will allow you to feel the tissue close to the ribs.  Continue the overlapping circles, moving downward over the breast until you feel your ribs below your breast.  Move one finger-width toward the center of the body. Continue to use the  inch (2 cm) overlapping circles to feel your breast as you move slowly up toward your collarbone.  Continue the up and down exam using all three pressures until you reach your armpit.  Write Down What You Find  Write down what is normal for each breast and any changes that you find. Keep a written record with breast changes or normal findings for each breast. By writing this information down, you do not need to depend only on memory for size, tenderness, or location. Write down where you are in your menstrual cycle, if you are still menstruating. If you are having trouble noticing differences in your breasts, do not get discouraged. With time you will become more familiar with the variations in your breasts and more comfortable with the exam. How often should I examine   my breasts? Examine your breasts every month. If you are breastfeeding, the best time to examine your breasts is after a feeding or after using a breast pump. If you menstruate, the best time to examine your breasts is 5-7 days after your period is over. During your period, your breasts are lumpier, and it may be more difficult to notice changes. When should I see my health care provider? See your health care provider if you notice:  A change in shape or size of your breasts or nipples.  A change in the skin of your breast or nipples, such as a reddened or scaly area.  Unusual discharge from your nipples.  A lump or thick area that was not there before.  Pain in your breasts.  Anything that concerns you.  

## 2020-08-26 LAB — CBC
Hematocrit: 39.3 % (ref 34.0–46.6)
Hemoglobin: 13.1 g/dL (ref 11.1–15.9)
MCH: 31 pg (ref 26.6–33.0)
MCHC: 33.3 g/dL (ref 31.5–35.7)
MCV: 93 fL (ref 79–97)
Platelets: 315 10*3/uL (ref 150–450)
RBC: 4.22 x10E6/uL (ref 3.77–5.28)
RDW: 12 % (ref 11.7–15.4)
WBC: 8 10*3/uL (ref 3.4–10.8)

## 2020-08-26 LAB — COMPREHENSIVE METABOLIC PANEL
ALT: 21 IU/L (ref 0–32)
AST: 16 IU/L (ref 0–40)
Albumin/Globulin Ratio: 1.7 (ref 1.2–2.2)
Albumin: 4.2 g/dL (ref 3.8–4.8)
Alkaline Phosphatase: 83 IU/L (ref 44–121)
BUN/Creatinine Ratio: 20 (ref 12–28)
BUN: 13 mg/dL (ref 8–27)
Bilirubin Total: <0.2 mg/dL (ref 0.0–1.2)
CO2: 25 mmol/L (ref 20–29)
Calcium: 9.3 mg/dL (ref 8.7–10.3)
Chloride: 101 mmol/L (ref 96–106)
Creatinine, Ser: 0.66 mg/dL (ref 0.57–1.00)
GFR calc Af Amer: 108 mL/min/{1.73_m2} (ref >59)
GFR calc non Af Amer: 94 mL/min/{1.73_m2} (ref >59)
Globulin, Total: 2.5 g/dL (ref 1.5–4.5)
Glucose: 81 mg/dL (ref 65–99)
Potassium: 3.9 mmol/L (ref 3.5–5.2)
Sodium: 139 mmol/L (ref 134–144)
Total Protein: 6.7 g/dL (ref 6.0–8.5)

## 2020-08-26 LAB — LIPID PANEL
Chol/HDL Ratio: 3.6 ratio (ref 0.0–4.4)
Cholesterol, Total: 242 mg/dL — ABNORMAL HIGH (ref 100–199)
HDL: 68 mg/dL (ref >39)
LDL Chol Calc (NIH): 151 mg/dL — ABNORMAL HIGH (ref 0–99)
Triglycerides: 132 mg/dL (ref 0–149)
VLDL Cholesterol Cal: 23 mg/dL (ref 5–40)

## 2020-08-26 LAB — VITAMIN D 25 HYDROXY (VIT D DEFICIENCY, FRACTURES): Vit D, 25-Hydroxy: 38.5 ng/mL (ref 30.0–100.0)

## 2020-10-16 ENCOUNTER — Other Ambulatory Visit: Payer: Self-pay | Admitting: Obstetrics and Gynecology

## 2020-10-16 DIAGNOSIS — M810 Age-related osteoporosis without current pathological fracture: Secondary | ICD-10-CM

## 2020-10-16 DIAGNOSIS — Z1231 Encounter for screening mammogram for malignant neoplasm of breast: Secondary | ICD-10-CM

## 2021-02-13 ENCOUNTER — Ambulatory Visit: Payer: Commercial Managed Care - PPO

## 2021-02-13 ENCOUNTER — Other Ambulatory Visit: Payer: Commercial Managed Care - PPO

## 2021-07-15 ENCOUNTER — Ambulatory Visit
Admission: RE | Admit: 2021-07-15 | Discharge: 2021-07-15 | Disposition: A | Discharge status: Home / Self Care | Payer: Medicare HMO | Source: Ambulatory Visit | Attending: Obstetrics and Gynecology | Admitting: Obstetrics and Gynecology

## 2021-07-15 ENCOUNTER — Encounter: Payer: Self-pay | Admitting: Obstetrics and Gynecology

## 2021-07-15 ENCOUNTER — Other Ambulatory Visit: Payer: Self-pay

## 2021-07-15 DIAGNOSIS — Z1231 Encounter for screening mammogram for malignant neoplasm of breast: Secondary | ICD-10-CM | POA: Diagnosis not present

## 2021-07-15 DIAGNOSIS — Z8249 Family history of ischemic heart disease and other diseases of the circulatory system: Secondary | ICD-10-CM | POA: Diagnosis not present

## 2021-07-15 DIAGNOSIS — R32 Unspecified urinary incontinence: Secondary | ICD-10-CM | POA: Diagnosis not present

## 2021-07-15 DIAGNOSIS — M810 Age-related osteoporosis without current pathological fracture: Secondary | ICD-10-CM

## 2021-07-15 DIAGNOSIS — R69 Illness, unspecified: Secondary | ICD-10-CM | POA: Diagnosis not present

## 2021-07-15 DIAGNOSIS — R030 Elevated blood-pressure reading, without diagnosis of hypertension: Secondary | ICD-10-CM | POA: Diagnosis not present

## 2021-07-15 DIAGNOSIS — Z780 Asymptomatic menopausal state: Secondary | ICD-10-CM | POA: Diagnosis not present

## 2021-07-15 DIAGNOSIS — M85832 Other specified disorders of bone density and structure, left forearm: Secondary | ICD-10-CM | POA: Diagnosis not present

## 2021-07-15 HISTORY — DX: Age-related osteoporosis without current pathological fracture: M81.0

## 2021-08-03 DIAGNOSIS — H40053 Ocular hypertension, bilateral: Secondary | ICD-10-CM | POA: Diagnosis not present

## 2021-08-03 DIAGNOSIS — H10413 Chronic giant papillary conjunctivitis, bilateral: Secondary | ICD-10-CM | POA: Diagnosis not present

## 2021-08-03 DIAGNOSIS — H2513 Age-related nuclear cataract, bilateral: Secondary | ICD-10-CM | POA: Diagnosis not present

## 2021-08-04 DIAGNOSIS — Z0100 Encounter for examination of eyes and vision without abnormal findings: Secondary | ICD-10-CM | POA: Diagnosis not present

## 2021-08-24 NOTE — Progress Notes (Signed)
65 y.o. G2P1 Married White or Caucasian Not Hispanic or Latino female here for breast and pelvic and to discuss dexa.  No vaginal bleeding, no dyspareunia.    She has a h/o osteoporosis, hasn't tolerated fosamax or boniva. She developed kidney stones, GERD, breast cysts.   Tolerable GSI, no change. Discussed Kegels.   Most recent DEXA was on 07/15/21. T score of -2.9. It was stable.  She has increased her calcium and vit d. She is getting 1,200 mg of calcium a day, vit D supplement (not sure of the dose), and she has started exercising.     Patient's last menstrual period was 01/25/2005.          Sexually active: Yes.    The current method of family planning is post menopausal status.    Exercising: Yes.     Silver sneakers, walking Smoker:  no  Health Maintenance: Pap:   08/10/2018 WNL NEG HPV, 06/01/2013 WNL History of abnormal Pap:  no MMG:  07/20/21 density B Bi-rads 1 neg  BMD:   07/15/21 osteoporotic T-score -2.9 Colonoscopy: 05/29/10 WNL, she will schedule it for 2023 TDaP:  08/22/19 Gardasil: n/a   reports that she has never smoked. She has never used smokeless tobacco. She reports current alcohol use of about 1.0 standard drink per week. She reports that she does not use drugs.1 daughter (local),  2 grandchildren, and one great grand daughter (almost 1).  She watches her GGD 2 days a week.  Past Medical History:  Diagnosis Date   Allergy    seasonal   Amenorrhea    Anemia    Glaucoma    trace   Hematuria    negative work up   Kidney stone    No pertinent past medical history    Osteoporosis     Past Surgical History:  Procedure Laterality Date   TUBAL LIGATION      Current Outpatient Medications  Medication Sig Dispense Refill   fluticasone (FLONASE) 50 MCG/ACT nasal spray Place 2 sprays into both nostrils daily. 16 g 6   Multiple Vitamin (MULTIVITAMIN PO) Take by mouth.     No current facility-administered medications for this visit.    Family History   Problem Relation Age of Onset   Arthritis Mother    Heart disease Father    Diabetes Paternal Aunt    Heart disease Paternal Aunt    Hyperlipidemia Brother    Uterine cancer Maternal Aunt    Ovarian cancer Maternal Aunt    Heart disease Paternal Uncle     Review of Systems  All other systems reviewed and are negative.  Exam:   BP 126/68 (BP Location: Left Arm, Patient Position: Sitting, Cuff Size: Normal)   Pulse (!) 110   Resp 14   Ht 5' 0.25" (1.53 m)   Wt 128 lb (58.1 kg)   LMP 01/25/2005   BMI 24.79 kg/m   Weight change: @WEIGHTCHANGE @ Height:   Height: 5' 0.25" (153 cm)  Ht Readings from Last 3 Encounters:  08/27/21 5' 0.25" (1.53 m)  08/25/20 5' (1.524 m)  08/22/19 5' 0.24" (1.53 m)    General appearance: alert, cooperative and appears stated age Head: Normocephalic, without obvious abnormality, atraumatic Neck: no adenopathy, supple, symmetrical, trachea midline and thyroid normal to inspection and palpation Breasts: normal appearance, no masses or tenderness Abdomen: soft, non-tender; non distended,  no masses,  no organomegaly Extremities: extremities normal, atraumatic, no cyanosis or edema Skin: Skin color, texture, turgor normal. No rashes  or lesions Lymph nodes: Cervical, supraclavicular, and axillary nodes normal. No abnormal inguinal nodes palpated Neurologic: Grossly normal   Pelvic: External genitalia:  no lesions              Urethra:  normal appearing urethra with no masses, tenderness or lesions              Bartholins and Skenes: normal                 Vagina: atrophic appearing vagina with normal color and discharge, no lesions              Cervix: no lesions               Bimanual Exam:  Uterus:  normal size, contour, position, consistency, mobility, non-tender              Adnexa: no mass, fullness, tenderness               Rectovaginal: Confirms               Anus:  normal sphincter tone, no lesions  Claudette Laws, CMA chaperoned for the  exam.  1. Gynecologic exam normal No pap this year Labs with primary Discussed breast self exam Discussed calcium and vit D intake Mammogram UTD Colonoscopy due, she will schedule  2. History of osteoporosis Declines medication, she didn't do well with boniva or fosamax We discussed recommendations for calcium and vit d She should be getting a total of 1,200 mg of calcium between her diet and supplement. If she is getting it in her diet, she doesn't need the supplement Recommended she get her vit d level checked with her primary We discussed avoiding fall risks and working on balance F/U DEXA in 10/24

## 2021-08-27 ENCOUNTER — Encounter: Payer: Self-pay | Admitting: Obstetrics and Gynecology

## 2021-08-27 ENCOUNTER — Other Ambulatory Visit: Payer: Self-pay

## 2021-08-27 ENCOUNTER — Ambulatory Visit (INDEPENDENT_AMBULATORY_CARE_PROVIDER_SITE_OTHER): Payer: Medicare HMO | Admitting: Obstetrics and Gynecology

## 2021-08-27 VITALS — BP 126/68 | HR 110 | Resp 14 | Ht 60.25 in | Wt 128.0 lb

## 2021-08-27 DIAGNOSIS — Z8739 Personal history of other diseases of the musculoskeletal system and connective tissue: Secondary | ICD-10-CM | POA: Diagnosis not present

## 2021-08-27 DIAGNOSIS — Z01419 Encounter for gynecological examination (general) (routine) without abnormal findings: Secondary | ICD-10-CM | POA: Diagnosis not present

## 2021-08-27 NOTE — Patient Instructions (Signed)
EXERCISE   We recommended that you start or continue a regular exercise program for good health. Physical activity is anything that gets your body moving, some is better than none. The CDC recommends 150 minutes per week of Moderate-Intensity Aerobic Activity and 2 or more days of Muscle Strengthening Activity.  Benefits of exercise are limitless: helps weight loss/weight maintenance, improves mood and energy, helps with depression and anxiety, improves sleep, tones and strengthens muscles, improves balance, improves bone density, protects from chronic conditions such as heart disease, high blood pressure and diabetes and so much more. To learn more visit: https://www.cdc.gov/physicalactivity/index.html  DIET: Good nutrition starts with a healthy diet of fruits, vegetables, whole grains, and lean protein sources. Drink plenty of water for hydration. Minimize empty calories, sodium, sweets. For more information about dietary recommendations visit: https://health.gov/our-work/nutrition-physical-activity/dietary-guidelines and https://www.myplate.gov/  ALCOHOL:  Women should limit their alcohol intake to no more than 7 drinks/beers/glasses of wine (combined, not each!) per week. Moderation of alcohol intake to this level decreases your risk of breast cancer and liver damage.  If you are concerned that you may have a problem, or your friends have told you they are concerned about your drinking, there are many resources to help. A well-known program that is free, effective, and available to all people all over the nation is Alcoholics Anonymous.  Check out this site to learn more: https://www.aa.org/   CALCIUM AND VITAMIN D:  Adequate intake of calcium and Vitamin D are recommended for bone health.  You should be getting between 1000-1200 mg of calcium and 800 units of Vitamin D daily between diet and supplements  PAP SMEARS:  Pap smears, to check for cervical cancer or precancers,  have traditionally been  done yearly, scientific advances have shown that most women can have pap smears less often.  However, every woman still should have a physical exam from her gynecologist every year. It will include a breast check, inspection of the vulva and vagina to check for abnormal growths or skin changes, a visual exam of the cervix, and then an exam to evaluate the size and shape of the uterus and ovaries. We will also provide age appropriate advice regarding health maintenance, like when you should have certain vaccines, screening for sexually transmitted diseases, bone density testing, colonoscopy, mammograms, etc.   MAMMOGRAMS:  All women over 40 years old should have a routine mammogram.   COLON CANCER SCREENING: Now recommend starting at age 45. At this time colonoscopy is not covered for routine screening until 50. There are take home tests that can be done between 45-49.   COLONOSCOPY:  Colonoscopy to screen for colon cancer is recommended for all women at age 50.  We know, you hate the idea of the prep.  We agree, BUT, having colon cancer and not knowing it is worse!!  Colon cancer so often starts as a polyp that can be seen and removed at colonscopy, which can quite literally save your life!  And if your first colonoscopy is normal and you have no family history of colon cancer, most women don't have to have it again for 10 years.  Once every ten years, you can do something that may end up saving your life, right?  We will be happy to help you get it scheduled when you are ready.  Be sure to check your insurance coverage so you understand how much it will cost.  It may be covered as a preventative service at no cost, but you should check   your particular policy.      Breast Self-Awareness Breast self-awareness means being familiar with how your breasts look and feel. It involves checking your breasts regularly and reporting any changes to your health care provider. Practicing breast self-awareness is  important. A change in your breasts can be a sign of a serious medical problem. Being familiar with how your breasts look and feel allows you to find any problems early, when treatment is more likely to be successful. All women should practice breast self-awareness, including women who have had breast implants. How to do a breast self-exam One way to learn what is normal for your breasts and whether your breasts are changing is to do a breast self-exam. To do a breast self-exam: Look for Changes  Remove all the clothing above your waist. Stand in front of a mirror in a room with good lighting. Put your hands on your hips. Push your hands firmly downward. Compare your breasts in the mirror. Look for differences between them (asymmetry), such as: Differences in shape. Differences in size. Puckers, dips, and bumps in one breast and not the other. Look at each breast for changes in your skin, such as: Redness. Scaly areas. Look for changes in your nipples, such as: Discharge. Bleeding. Dimpling. Redness. A change in position. Feel for Changes Carefully feel your breasts for lumps and changes. It is best to do this while lying on your back on the floor and again while sitting or standing in the shower or tub with soapy water on your skin. Feel each breast in the following way: Place the arm on the side of the breast you are examining above your head. Feel your breast with the other hand. Start in the nipple area and make  inch (2 cm) overlapping circles to feel your breast. Use the pads of your three middle fingers to do this. Apply light pressure, then medium pressure, then firm pressure. The light pressure will allow you to feel the tissue closest to the skin. The medium pressure will allow you to feel the tissue that is a little deeper. The firm pressure will allow you to feel the tissue close to the ribs. Continue the overlapping circles, moving downward over the breast until you feel your  ribs below your breast. Move one finger-width toward the center of the body. Continue to use the  inch (2 cm) overlapping circles to feel your breast as you move slowly up toward your collarbone. Continue the up and down exam using all three pressures until you reach your armpit.  Write Down What You Find  Write down what is normal for each breast and any changes that you find. Keep a written record with breast changes or normal findings for each breast. By writing this information down, you do not need to depend only on memory for size, tenderness, or location. Write down where you are in your menstrual cycle, if you are still menstruating. If you are having trouble noticing differences in your breasts, do not get discouraged. With time you will become more familiar with the variations in your breasts and more comfortable with the exam. How often should I examine my breasts? Examine your breasts every month. If you are breastfeeding, the best time to examine your breasts is after a feeding or after using a breast pump. If you menstruate, the best time to examine your breasts is 5-7 days after your period is over. During your period, your breasts are lumpier, and it may be more   difficult to notice changes. When should I see my health care provider? See your health care provider if you notice: A change in shape or size of your breasts or nipples. A change in the skin of your breast or nipples, such as a reddened or scaly area. Unusual discharge from your nipples. A lump or thick area that was not there before. Pain in your breasts. Anything that concerns you. Osteoporosis Osteoporosis happens when the bones become thin and less dense than normal. Osteoporosis makes bones more brittle and fragile and more likely to break (fracture). Over time, osteoporosis can cause your bones to become so weak that they fracture after a minor fall. Bones in the hip, wrist, and spine are most likely to fracture  due to osteoporosis. What are the causes? The exact cause of this condition is not known. What increases the risk? You are more likely to develop this condition if you: Have family members with this condition. Have poor nutrition. Use the following: Steroid medicines, such as prednisone. Anti-seizure medicines. Nicotine or tobacco, such as cigarettes, e-cigarettes, and chewing tobacco. Are female. Are age 81 or older. Are not physically active (are sedentary). Are of European or Asian descent. Have a small body frame. What are the signs or symptoms? A fracture might be the first sign of osteoporosis, especially if the fracture results from a fall or injury that usually would not cause a bone to break. Other signs and symptoms include: Pain in the neck or low back. Stooped posture. Loss of height. How is this diagnosed? This condition may be diagnosed based on: Your medical history. A physical exam. A bone mineral density test, also called a DXA or DEXA test (dual-energy X-ray absorptiometry test). This test uses X-rays to measure the amount of minerals in your bones. How is this treated? This condition may be treated by: Making lifestyle changes, such as: Including foods with more calcium and vitamin D in your diet. Doing weight-bearing and muscle-strengthening exercises. Stopping tobacco use. Limiting alcohol intake. Taking medicine to slow the process of bone loss or to increase bone density. Taking daily supplements of calcium and vitamin D. Taking hormone replacement medicines, such as estrogen for women and testosterone for men. Monitoring your levels of calcium and vitamin D. The goal of treatment is to strengthen your bones and lower your risk for a fracture. Follow these instructions at home: Eating and drinking Include calcium and vitamin D in your diet. Calcium is important for bone health, and vitamin D helps your body absorb calcium. Good sources of calcium and  vitamin D include: Certain fatty fish, such as salmon and tuna. Products that have calcium and vitamin D added to them (are fortified), such as fortified cereals. Egg yolks. Cheese. Liver.  Activity Do exercises as told by your health care provider. Ask your health care provider what exercises and activities are safe for you. You should do: Exercises that make you work against gravity (weight-bearing exercises), such as tai chi, yoga, or walking. Exercises to strengthen muscles, such as lifting weights. Lifestyle Do not drink alcohol if: Your health care provider tells you not to drink. You are pregnant, may be pregnant, or are planning to become pregnant. If you drink alcohol: Limit how much you use to: 0-1 drink a day for women. 0-2 drinks a day for men. Know how much alcohol is in your drink. In the [place] one drink equals one 12 oz bottle of beer (355 mL), one 5 oz glass of wine (148  mL), or one 1 oz glass of hard liquor (44 mL). Do not use any products that contain nicotine or tobacco, such as cigarettes, e-cigarettes, and chewing tobacco. If you need help quitting, ask your health care provider. Preventing falls Use devices to help you move around (mobility aids) as needed, such as canes, walkers, scooters, or crutches. Keep rooms well-lit and clutter-free. Remove tripping hazards from walkways, including cords and throw rugs. Install grab bars in bathrooms and safety rails on stairs. Use rubber mats in the bathroom and other areas that are often wet or slippery. Wear closed-toe shoes that fit well and support your feet. Wear shoes that have rubber soles or low heels. Review your medicines with your health care provider. Some medicines can cause dizziness or changes in blood pressure, which can increase your risk of falling. General instructions Take over-the-counter and prescription medicines only as told by your health care provider. Keep all follow-up visits. This is  important. Contact a health care provider if: You have never been screened for osteoporosis and you are: A woman who is age 7 or older. A man who is age 70 or older. Get help right away if: You fall or injure yourself. Summary Osteoporosis is thinning and loss of density in your bones. This makes bones more brittle and fragile and more likely to break (fracture),even with minor falls. The goal of treatment is to strengthen your bones and lower your risk for a fracture. Include calcium and vitamin D in your diet. Calcium is important for bone health, and vitamin D helps your body absorb calcium. Talk with your health care provider about screening for osteoporosis if you are a woman who is age 28 or older, or a man who is age 63 or older. This information is not intended to replace advice given to you by your health care provider. Make sure you discuss any questions you have with your health care provider. Document Revised: 02/28/2020 Document Reviewed: 02/28/2020 Elsevier Patient Education  [place].

## 2021-10-19 DIAGNOSIS — L570 Actinic keratosis: Secondary | ICD-10-CM | POA: Diagnosis not present

## 2021-10-19 DIAGNOSIS — L820 Inflamed seborrheic keratosis: Secondary | ICD-10-CM | POA: Diagnosis not present

## 2021-11-25 IMAGING — MG MM DIGITAL SCREENING BILAT W/ TOMO AND CAD
6 of 10 series · 6 of 30 positions shown · non-contrast
Comparison: Previous exam(s).

CLINICAL DATA: Screening.

EXAM:
DIGITAL SCREENING BILATERAL MAMMOGRAM WITH TOMOSYNTHESIS AND CAD
TECHNIQUE: Bilateral screening digital craniocaudal and mediolateral oblique
mammograms were obtained. Bilateral screening digital breast
tomosynthesis was performed. The images were evaluated with
computer-aided detection.

[L MLO synth-2D (1 of 2)]
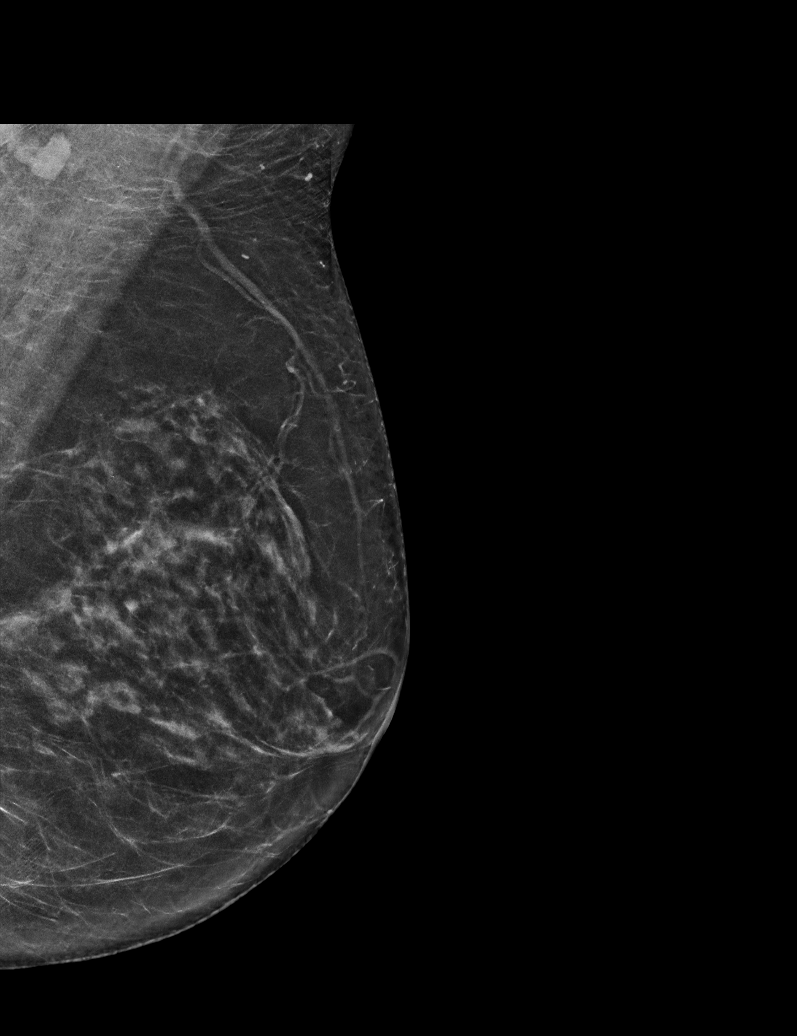

[L MLO synth-2D (2 of 2)]
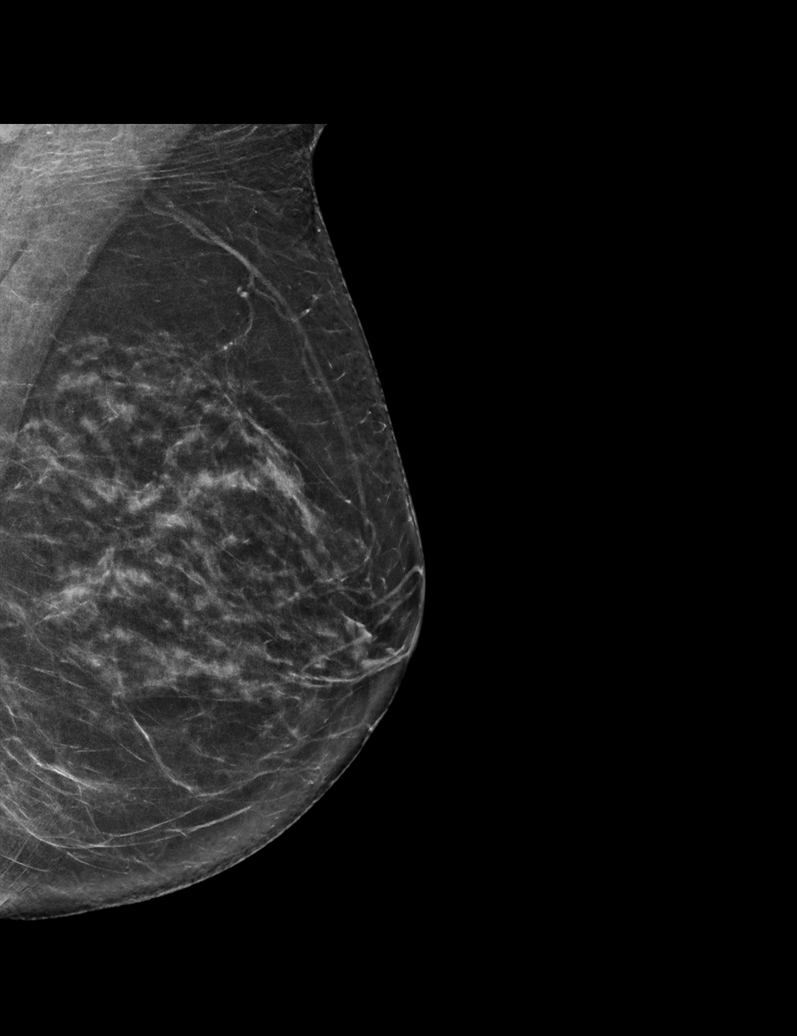

[L CC synth-2D]
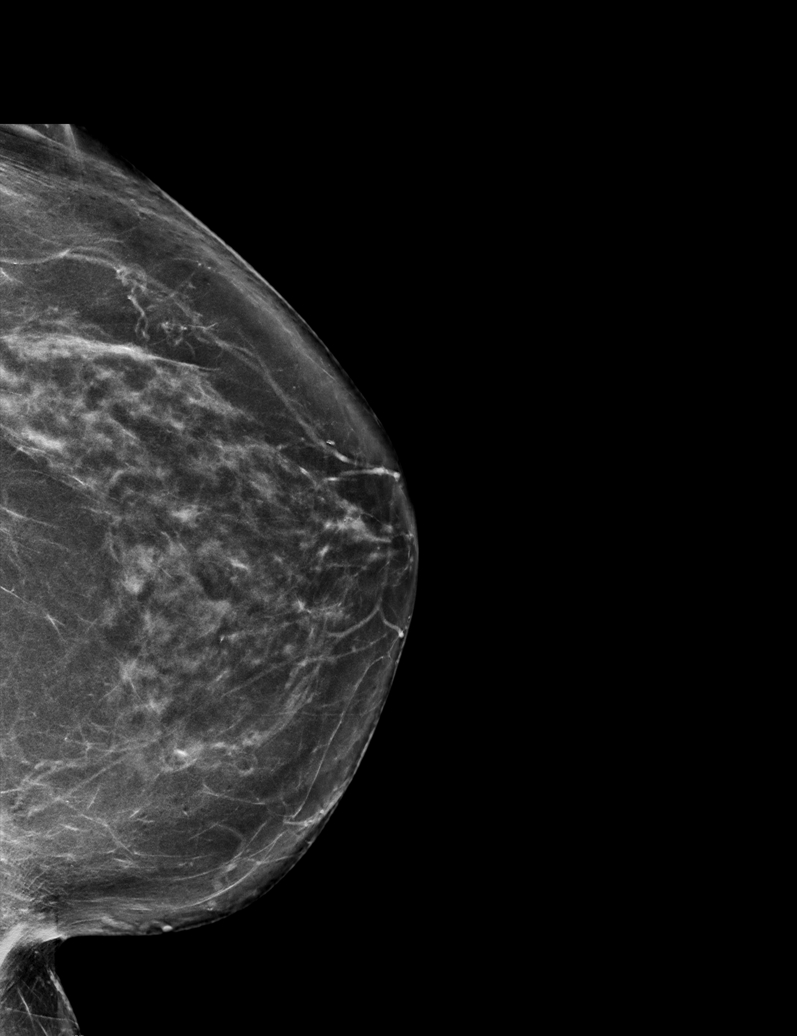

[R CC synth-2D]
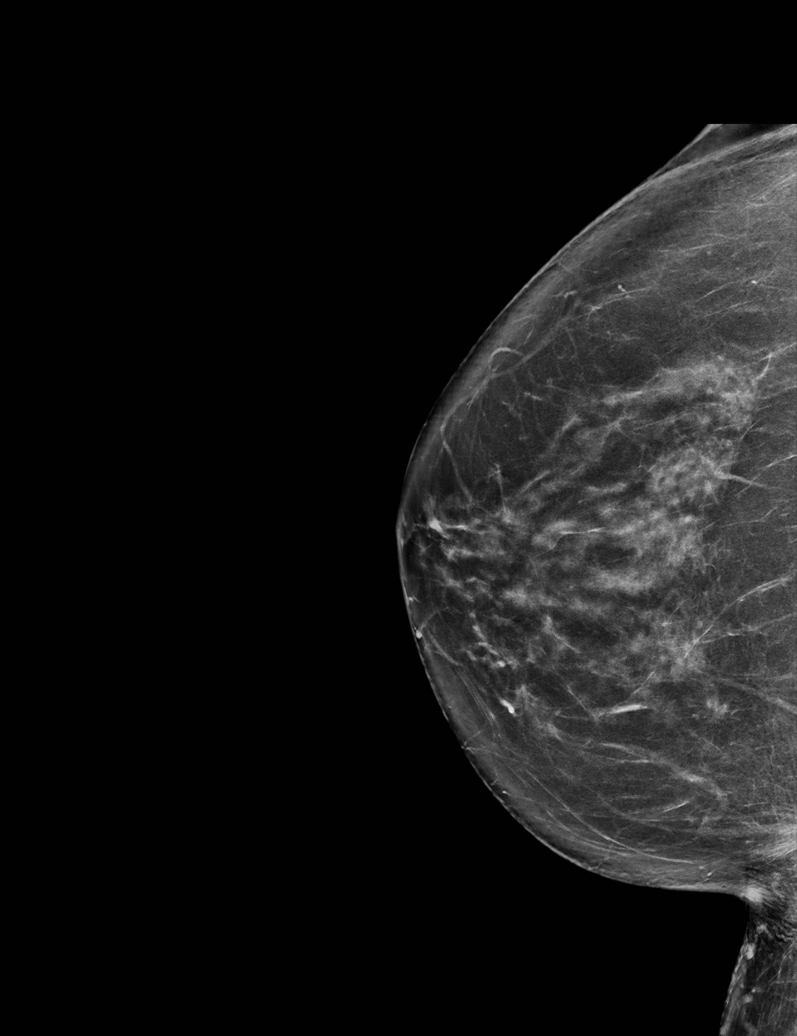

[R MLO synth-2D]
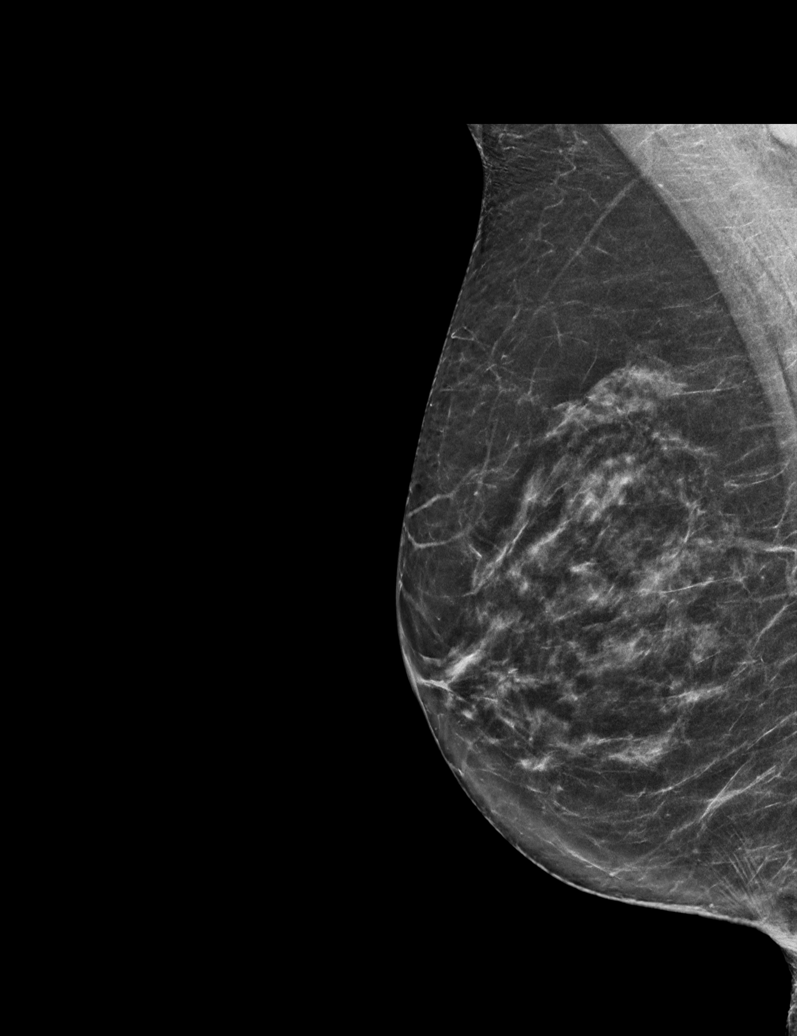

[R MLO tomo · tomo slice 32/63.0]
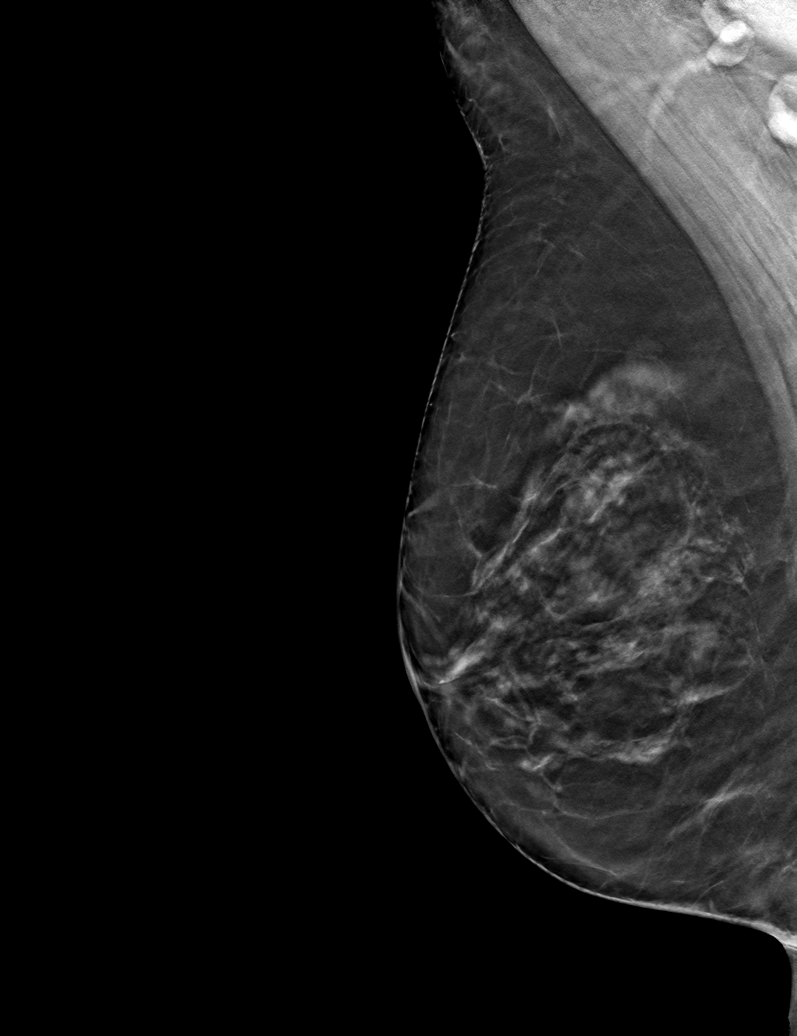

[6 of 30 positions shown; findings below may reference images not displayed]

ACR Breast Density Category b: There are scattered areas of
fibroglandular density.
FINDINGS: There are no findings suspicious for malignancy.
IMPRESSION: No mammographic evidence of malignancy. A result letter of this
screening mammogram will be mailed directly to the patient.

RECOMMENDATION:
Screening mammogram in one year. (Code:51-[id number])

BI-RADS CATEGORY  1: Negative.

## 2022-01-20 DIAGNOSIS — L820 Inflamed seborrheic keratosis: Secondary | ICD-10-CM | POA: Diagnosis not present

## 2022-01-21 ENCOUNTER — Ambulatory Visit (INDEPENDENT_AMBULATORY_CARE_PROVIDER_SITE_OTHER): Payer: Medicare HMO | Admitting: Family Medicine

## 2022-01-21 ENCOUNTER — Encounter: Payer: Self-pay | Admitting: Family Medicine

## 2022-01-21 VITALS — BP 112/70 | HR 90 | Temp 98.5°F | Ht 60.0 in | Wt 128.0 lb

## 2022-01-21 DIAGNOSIS — H9312 Tinnitus, left ear: Secondary | ICD-10-CM

## 2022-01-21 DIAGNOSIS — N3001 Acute cystitis with hematuria: Secondary | ICD-10-CM | POA: Diagnosis not present

## 2022-01-21 DIAGNOSIS — Z7689 Persons encountering health services in other specified circumstances: Secondary | ICD-10-CM

## 2022-01-21 DIAGNOSIS — R102 Pelvic and perineal pain: Secondary | ICD-10-CM

## 2022-01-21 DIAGNOSIS — R300 Dysuria: Secondary | ICD-10-CM

## 2022-01-21 DIAGNOSIS — E7800 Pure hypercholesterolemia, unspecified: Secondary | ICD-10-CM

## 2022-01-21 DIAGNOSIS — M810 Age-related osteoporosis without current pathological fracture: Secondary | ICD-10-CM

## 2022-01-21 DIAGNOSIS — H6592 Unspecified nonsuppurative otitis media, left ear: Secondary | ICD-10-CM

## 2022-01-21 HISTORY — DX: Pure hypercholesterolemia, unspecified: E78.00

## 2022-01-21 LAB — MICROSCOPIC EXAMINATION: Renal Epithel, UA: NONE SEEN /hpf

## 2022-01-21 LAB — URINALYSIS, ROUTINE W REFLEX MICROSCOPIC
Bilirubin, UA: NEGATIVE
Glucose, UA: NEGATIVE
Ketones, UA: NEGATIVE
Nitrite, UA: NEGATIVE
Protein,UA: NEGATIVE
Specific Gravity, UA: 1.02 (ref 1.005–1.030)
Urobilinogen, Ur: 0.2 mg/dL (ref 0.2–1.0)
pH, UA: 6 (ref 5.0–7.5)

## 2022-01-21 MED ORDER — CEPHALEXIN 500 MG PO CAPS: 500.0000 mg | ORAL_CAPSULE | Freq: Three times a day (TID) | ORAL | 0 refills | Status: AC

## 2022-01-21 MED ORDER — FLUTICASONE PROPIONATE 50 MCG/ACT NA SUSP: 2.0000 | Freq: Every day | NASAL | 6 refills | Status: DC

## 2022-01-21 MED ORDER — LORATADINE 10 MG PO TABS: 10.0000 mg | ORAL_TABLET | Freq: Every day | ORAL | 11 refills | Status: AC

## 2022-01-21 NOTE — Patient Instructions (Signed)
?Osteoporosis ?Your recent bone density scan demonstrated that you have osteoporosis.  Today, I provided you a handout reviewing calcium and vitamin D and ways to incorporate this in your diet.  You need to take a calcium and vitamin D supplement daily. I would also like to start you on Prolia to prevent further loss of bone density. This is an injection that is given every 6 months at the office. Please let us know if this is something you would like to start. Strength training is just as important in maintaining bone health.  A copy of home exercises has been provided to you. You need to repeat your bone density in 2 years.  ? ? ?Osteoporosis is the thinning and loss of density in the bones. Osteoporosis makes the bones more brittle, fragile, and likely to break (fracture). Over time, osteoporosis can cause the bones to become so weak that they fracture after a simple fall. The bones most likely to fracture are the bones in the hip, wrist, and spine. ?What are the causes? ?The exact cause is not known. ?What increases the risk? ?Anyone can develop osteoporosis. You may be at greater risk if you have a family history of the condition or have poor nutrition. You may also have a higher risk if you are: ?Female. ?49 years old or older. ?A smoker. ?Not physically active. ?White or Asian. ?Slender. ? ?What are the signs or symptoms? ?A fracture might be the first sign of the disease, especially if it results from a fall or injury that would not usually cause a bone to break. Other signs and symptoms include: ?Low back and neck pain. ?Stooped posture. ?Height loss. ? ?How is this diagnosed? ?To make a diagnosis, your health care provider may: ?Take a medical history. ?Perform a physical exam. ?Order tests, such as: ?A bone mineral density test. ?A dual-energy X-ray absorptiometry test. ? ?How is this treated? ?The goal of osteoporosis treatment is to strengthen your bones to reduce your risk of a fracture. Treatment may  involve: ?Making lifestyle changes, such as: ?Eating a diet rich in calcium. ?Doing weight-bearing and muscle-strengthening exercises. ?Stopping tobacco use. ?Limiting alcohol intake. ?Taking medicine to slow the process of bone loss or to increase bone density. ?Monitoring your levels of calcium and vitamin D. ? ?Follow these instructions at home: ?Include calcium and vitamin D in your diet. Calcium is important for bone health, and vitamin D helps the body absorb calcium. Os-Cal is a great over the counter supplement to take daily. It has calcium and Vitamin D.  ?Perform weight-bearing and muscle-strengthening exercises as directed by your health care provider. Exercises listed below.  ?Do not use any tobacco products, including cigarettes, chewing tobacco, and electronic cigarettes. If you need help quitting, ask your health care provider. ?Limit your alcohol intake. ?Take medicines only as directed by your health care provider. ?Keep all follow-up visits as directed by your health care provider. This is important. ?Take precautions at home to lower your risk of falling, such as: ?Keeping rooms well lit and clutter free. ?Installing safety rails on stairs. ?Using rubber mats in the bathroom and other areas that are often wet or slippery. ? ?Get help right away if: ?You fall or injure yourself. ?This information is not intended to replace advice given to you by your health care provider. Make sure you discuss any questions you have with your health care provider. ?Document Released: 06/23/2005 Document Revised: 02/16/2016 Document Reviewed: 02/21/2014 ?Elsevier Interactive Patient Education ? 2018 [employer]  [employer] ? ?Exercise for Strong Bones ? ?Exercise is important to build and maintain strong bones / bone density.  There are 2 types of exercises that are important to building and maintaining strong bones:  Weight- bearing and muscle-stregthening. ? ?Weight-bearing Exercises ? ?These exercises include activities  that make you move against gravity while staying upright. Weight-bearing exercises can be high-impact or low-impact. ? ?High-impact weight-bearing exercises help build bones and keep them strong. If you have broken a bone due to osteoporosis or are at risk of breaking a bone, you may need to avoid high-impact exercises. If you?re not sure, you should check with your healthcare provider. ? ?Examples of high-impact weight-bearing exercises are: ?Dancing  ?Doing high-impact aerobics  ?Hiking  ?Jogging/running  ?Jumping Rope  ?Stair climbing  ?Tennis ? ?Low-impact weight-bearing exercises can also help keep bones strong and are a safe alternative if you cannot do high-impact exercises.  ? ?Examples of low-impact weight-bearing exercises are: ?Using elliptical training machines  ?Doing low-impact aerobics  ?Using stair-step machines  ?Fast walking on a treadmill or outside ? ? ?Muscle-Strengthening Exercises ?These exercises include activities where you move your body, a weight or some other resistance against gravity. They are also known as resistance exercises and include: ?Lifting weights  ?Using elastic exercise bands  ?Using weight machines  ?Lifting your own body weight  ?Functional movements, such as standing and rising up on your toes ? ?Yoga and Pilates can also improve strength, balance and flexibility. However, certain positions may not be safe for people with osteoporosis or those at increased risk of broken bones. For example, exercises that have you bend forward may increase the chance of breaking a bone in the spine.  ? ?Non-Impact Exercises ?There are other types of exercises that can help prevent falls.  Non-impact exercises can help you to improve balance, posture and how well you move in everyday activities. Some of these exercises include: ?Balance exercises that strengthen your legs and test your balance, such as Tai Chi, can decrease your risk of falls.  ?Posture exercises that improve your posture  and reduce rounded or ?sloping? shoulders can help you decrease the chance of breaking a bone, especially in the spine.  ?Functional exercises that improve how well you move can help you with everyday activities and decrease your chance of falling and breaking a bone. For example, if you have trouble getting up from a chair or climbing stairs, you should do these activities as exercises. ? ? ?A physical therapist can teach you balance, posture and functional exercises. ?He/she can also help you learn which exercises are safe and appropriate for you.  [facility] has a physical therapy office in [place] in front of our office and referrals can be made for assessments and treatment as needed and strength and balance training.  If you would like to have an assessment with [facility] and our physical therapy team please let a nurse or provider know. ? ?For more information go to the [facility] website: [email or web address].   ?Click the striped box in the right upper corner.   ?Then click through patient info in the left lower hand corner to out more about osteoporosis and osteopenia and how you can prevent fractures. ? ? ? ? ? ?Calcium & Vitamin D: The Facts ? ?Why is calcium and vitamin D consumption important? ?Calcium: ?Most Americans do not consume adequate amounts of calcium! Calcium is required for proper muscle function, nerve communication, bone support, and many other functions in  the body.  ?The body uses bones as a source of calcium. Bones ?remodel? themselves continuously - the body constantly breaks bone down to release calcium and rebuilds bones by replacing calcium in the bone later.  ?As we get older, the rate of bone breakdown occurs faster than bone rebuilding which could lead to osteopenia, osteoporosis, and possible fractures.  ? ?Vitamin D: ?People naturally make vitamin D in the body when sunlight hits the skin and triggers a process that leads to vitamin D production. This natural  vitamin D production requires about 10-15 minutes of sun exposure on the hands, arms, and face at least 2-3 times per week. However, due to decreased sun exposure and the use of sunscreen, most people

## 2022-01-21 NOTE — Progress Notes (Signed)
?  ? ?Subjective:  ?Patient ID: Colleen Elliott, female    DOB: 1955/12/11, 66 y.o.   MRN: [id number] ? ?Patient Care Team: ?Dettinger, Elige Radon, MD as PCP - General (Family Medicine) ?Romualdo Bolk, MD as Consulting Physician (Obstetrics and Gynecology)  ? ?Chief Complaint:  New Patient (Initial Visit) (Re-establish care//UTI symptoms//Tinnitus left ear) ? ? ?HPI: ?Colleen Elliott is a 66 y.o. female presenting on 01/21/2022 for New Patient (Initial Visit) (Re-establish care//UTI symptoms//Tinnitus left ear) ? ? ?Pt presents today to establish care with new PCP and for evaluation of dysuria and tinnitus. She is followed by her GYN on a regular basis and states her PAP and mammogram are up to date. DEXA is due next year. Colonoscopy is due, states her GYN has referred her for this. She does have osteoporosis, not on fosamax or Prolia. Does take calcium and Vit D on a daily basis. Lipids have been elevated in the past, she is not on statin. She does follow a healthy diet and exercise daily.  ?She reports dysuria, urgency, and frequency over the last 3 days. She has lower abdominal pressure with voiding. No flank pain, fever, chills, weakness, confusion, nausea, or vomiting.  ?Reports tinnitus in the left ear for several years. Not new or worsening. No otalgia. No hearing loss. States started after COVID and has not resolved.  ? ? ? ?Relevant past medical, surgical, family, and social history reviewed and updated as indicated.  ?Allergies and medications reviewed and updated. Data reviewed: Chart in Epic. ? ? ?Past Medical History:  ?Diagnosis Date  ? Allergy   ? seasonal  ? Amenorrhea   ? Anemia   ? Glaucoma   ? trace  ? Hematuria   ? negative work up  ? Kidney stone   ? No pertinent past medical history   ? Osteoporosis   ? Pure hypercholesterolemia 01/21/2022  ? ? ?Past Surgical History:  ?Procedure Laterality Date  ? TUBAL LIGATION    ? ? ?Social History  ? ?Socioeconomic History  ? Marital status:  Married  ?  Spouse name: Not on file  ? Number of children: Not on file  ? Years of education: Not on file  ? Highest education level: Not on file  ?Occupational History  ? Not on file  ?Tobacco Use  ? Smoking status: Never  ? Smokeless tobacco: Never  ?Vaping Use  ? Vaping Use: Never used  ?Substance and Sexual Activity  ? Alcohol use: Yes  ?  Alcohol/week: 1.0 standard drink  ?  Types: 1 Standard drinks or equivalent per week  ? Drug use: No  ? Sexual activity: Yes  ?  Partners: Male  ?  Birth control/protection: Surgical  ?  Comment: tubal ligation  ?Other Topics Concern  ? Not on file  ?Social History Narrative  ? Not on file  ? ?Social Determinants of Health  ? ?Financial Resource Strain: Not on file  ?Food Insecurity: Not on file  ?Transportation Needs: Not on file  ?Physical Activity: Not on file  ?Stress: Not on file  ?Social Connections: Not on file  ?Intimate Partner Violence: Not on file  ? ? ?Outpatient Encounter Medications as of 01/21/2022  ?Medication Sig  ? cephALEXin (KEFLEX) 500 MG capsule Take 1 capsule (500 mg total) by mouth 3 (three) times daily for 7 days.  ? fluticasone (FLONASE) 50 MCG/ACT nasal spray Place 2 sprays into both nostrils daily.  ? loratadine (CLARITIN) 10 MG tablet Take 1 tablet (10  mg total) by mouth daily.  ? Multiple Vitamin (MULTIVITAMIN PO) Take by mouth.  ? [DISCONTINUED] fluticasone (FLONASE) 50 MCG/ACT nasal spray Place 2 sprays into both nostrils daily.  ? ?No facility-administered encounter medications on file as of 01/21/2022.  ? ? ?Allergies  ?Allergen Reactions  ? Ciprofloxacin   ?  Bad headache  ? ? ?Review of Systems  ?Constitutional:  Negative for activity change, appetite change, chills, diaphoresis, fatigue, fever and unexpected weight change.  ?HENT:  Positive for tinnitus. Negative for congestion, dental problem, drooling, ear discharge, ear pain, facial swelling, hearing loss, mouth sores, nosebleeds, postnasal drip, rhinorrhea, sinus pressure, sinus pain,  sneezing, sore throat, trouble swallowing and voice change.   ?Eyes: Negative.  Negative for photophobia and visual disturbance.  ?Respiratory:  Negative for cough, chest tightness and shortness of breath.   ?Cardiovascular:  Negative for chest pain, palpitations and leg swelling.  ?Gastrointestinal:  Positive for abdominal pain (suprapubc pressure with voiding). Negative for blood in stool, constipation, diarrhea, nausea and vomiting.  ?Endocrine: Negative.   ?Genitourinary:  Positive for dysuria, frequency and urgency. Negative for decreased urine volume, difficulty urinating, dyspareunia, enuresis, flank pain, genital sores, hematuria, pelvic pain, vaginal bleeding, vaginal discharge and vaginal pain.  ?Musculoskeletal:  Negative for arthralgias and myalgias.  ?Skin: Negative.   ?Allergic/Immunologic: Negative.   ?Neurological:  Negative for dizziness, tremors, seizures, syncope, facial asymmetry, speech difficulty, weakness, light-headedness, numbness and headaches.  ?Hematological: Negative.   ?Psychiatric/Behavioral:  Negative for confusion, hallucinations, sleep disturbance and suicidal ideas.   ?All other systems reviewed and are negative. ? ?   ? ?Objective:  ?BP 112/70   Pulse 90   Temp 98.5 ?F (36.9 ?C)   Ht 5' (1.524 m)   Wt 128 lb (58.1 kg)   LMP 01/25/2005   SpO2 96%   BMI 25.00 kg/m?   ? ?Wt Readings from Last 3 Encounters:  ?01/21/22 128 lb (58.1 kg)  ?08/27/21 128 lb (58.1 kg)  ?08/25/20 131 lb 12.8 oz (59.8 kg)  ? ? ?Physical Exam ?Vitals and nursing note reviewed.  ?Constitutional:   ?   General: She is not in acute distress. ?   Appearance: Normal appearance. She is well-developed, well-groomed and normal weight. She is not ill-appearing, toxic-appearing or diaphoretic.  ?HENT:  ?   Head: Normocephalic and atraumatic.  ?   Jaw: There is normal jaw occlusion.  ?   Right Ear: Hearing, ear canal and external ear normal. No decreased hearing noted. A middle ear effusion is present. Tympanic  membrane is not erythematous.  ?   Left Ear: Hearing, ear canal and external ear normal. No decreased hearing noted. A middle ear effusion is present. Tympanic membrane is not erythematous.  ?   Ears:  ?   Weber exam findings: Does not lateralize. ?   Right Rinne: AC > BC. ?   Left Rinne: AC > BC. ?   Nose: Nose normal.  ?   Mouth/Throat:  ?   Lips: Pink.  ?   Mouth: Mucous membranes are moist.  ?   Pharynx: Oropharynx is clear. Uvula midline.  ?Eyes:  ?   General: Lids are normal.  ?   Extraocular Movements: Extraocular movements intact.  ?   Conjunctiva/sclera: Conjunctivae normal.  ?   Pupils: Pupils are equal, round, and reactive to light.  ?Neck:  ?   Thyroid: No thyroid mass, thyromegaly or thyroid tenderness.  ?   Vascular: No carotid bruit or JVD.  ?  Trachea: Trachea and phonation normal.  ?Cardiovascular:  ?   Rate and Rhythm: Normal rate and regular rhythm.  ?   Chest Wall: PMI is not displaced.  ?   Pulses: Normal pulses.  ?   Heart sounds: Normal heart sounds. No murmur heard. ?  No friction rub. No gallop.  ?Pulmonary:  ?   Effort: Pulmonary effort is normal. No respiratory distress.  ?   Breath sounds: Normal breath sounds. No wheezing.  ?Abdominal:  ?   General: Bowel sounds are normal. There is no distension or abdominal bruit.  ?   Palpations: Abdomen is soft. There is no hepatomegaly or splenomegaly.  ?   Tenderness: There is no abdominal tenderness. There is no right CVA tenderness or left CVA tenderness.  ?   Hernia: No hernia is present.  ?Musculoskeletal:     ?   General: Normal range of motion.  ?   Cervical back: Normal range of motion and neck supple.  ?   Right lower leg: No edema.  ?   Left lower leg: No edema.  ?Lymphadenopathy:  ?   Cervical: No cervical adenopathy.  ?Skin: ?   General: Skin is warm and dry.  ?   Capillary Refill: Capillary refill takes less than 2 seconds.  ?   Coloration: Skin is not cyanotic, jaundiced or pale.  ?   Findings: No rash.  ?Neurological:  ?   General:  No focal deficit present.  ?   Mental Status: She is alert and oriented to person, place, and time.  ?   Sensory: Sensation is intact.  ?   Motor: Motor function is intact.  ?   Coordination: Coordination is intact.

## 2022-01-22 LAB — CBC WITH DIFFERENTIAL/PLATELET
Basophils Absolute: 0.1 10*3/uL (ref 0.0–0.2)
Basos: 1 %
EOS (ABSOLUTE): 0.2 10*3/uL (ref 0.0–0.4)
Eos: 3 %
Hematocrit: 35.5 % (ref 34.0–46.6)
Hemoglobin: 12 g/dL (ref 11.1–15.9)
Immature Grans (Abs): 0.1 10*3/uL (ref 0.0–0.1)
Immature Granulocytes: 1 %
Lymphocytes Absolute: 2.7 10*3/uL (ref 0.7–3.1)
Lymphs: 38 %
MCH: 31.1 pg (ref 26.6–33.0)
MCHC: 33.8 g/dL (ref 31.5–35.7)
MCV: 92 fL (ref 79–97)
Monocytes Absolute: 0.4 10*3/uL (ref 0.1–0.9)
Monocytes: 5 %
Neutrophils Absolute: 3.6 10*3/uL (ref 1.4–7.0)
Neutrophils: 52 %
Platelets: 308 10*3/uL (ref 150–450)
RBC: 3.86 x10E6/uL (ref 3.77–5.28)
RDW: 12.4 % (ref 11.7–15.4)
WBC: 6.9 10*3/uL (ref 3.4–10.8)

## 2022-01-22 LAB — LIPID PANEL
Chol/HDL Ratio: 3.6 ratio (ref 0.0–4.4)
Cholesterol, Total: 212 mg/dL — ABNORMAL HIGH (ref 100–199)
HDL: 59 mg/dL (ref >39)
LDL Chol Calc (NIH): 129 mg/dL — ABNORMAL HIGH (ref 0–99)
Triglycerides: 137 mg/dL (ref 0–149)
VLDL Cholesterol Cal: 24 mg/dL (ref 5–40)

## 2022-01-22 LAB — CMP14+EGFR
ALT: 24 IU/L (ref 0–32)
AST: 19 IU/L (ref 0–40)
Albumin/Globulin Ratio: 1.7 (ref 1.2–2.2)
Albumin: 4 g/dL (ref 3.8–4.8)
Alkaline Phosphatase: 88 IU/L (ref 44–121)
BUN/Creatinine Ratio: 19 (ref 12–28)
BUN: 13 mg/dL (ref 8–27)
Bilirubin Total: <0.2 mg/dL (ref 0.0–1.2)
CO2: 26 mmol/L (ref 20–29)
Calcium: 8.9 mg/dL (ref 8.7–10.3)
Chloride: 102 mmol/L (ref 96–106)
Creatinine, Ser: 0.67 mg/dL (ref 0.57–1.00)
Globulin, Total: 2.4 g/dL (ref 1.5–4.5)
Glucose: 92 mg/dL (ref 70–99)
Potassium: 4.5 mmol/L (ref 3.5–5.2)
Sodium: 141 mmol/L (ref 134–144)
Total Protein: 6.4 g/dL (ref 6.0–8.5)
eGFR: 97 mL/min/{1.73_m2} (ref >59)

## 2022-01-22 LAB — THYROID PANEL WITH TSH
Free Thyroxine Index: 2 (ref 1.2–4.9)
T3 Uptake Ratio: 26 % (ref 24–39)
T4, Total: 7.7 ug/dL (ref 4.5–12.0)
TSH: 2.89 u[IU]/mL (ref 0.450–4.500)

## 2022-01-23 LAB — URINE CULTURE

## 2022-02-08 DIAGNOSIS — H2513 Age-related nuclear cataract, bilateral: Secondary | ICD-10-CM | POA: Diagnosis not present

## 2022-02-08 DIAGNOSIS — H10413 Chronic giant papillary conjunctivitis, bilateral: Secondary | ICD-10-CM | POA: Diagnosis not present

## 2022-02-08 DIAGNOSIS — H21233 Degeneration of iris (pigmentary), bilateral: Secondary | ICD-10-CM | POA: Diagnosis not present

## 2022-02-08 DIAGNOSIS — H40053 Ocular hypertension, bilateral: Secondary | ICD-10-CM | POA: Diagnosis not present

## 2022-02-16 ENCOUNTER — Other Ambulatory Visit: Payer: Self-pay

## 2022-02-16 ENCOUNTER — Telehealth: Payer: Self-pay | Admitting: Family Medicine

## 2022-02-16 DIAGNOSIS — R300 Dysuria: Secondary | ICD-10-CM

## 2022-02-16 NOTE — Telephone Encounter (Signed)
  Incoming Patient Call  02/16/2022  What symptoms do you have? Urinating frequently started back up again it is like it just never went away  How long have you been sick? Started back again 4 days ago, she has been drinking a lot of water and cranberry juice hoping it would clear up but it is getting worse  Have you been seen for this problem? Yes by Marcelino Duster and told to call back and let her know if UTI did not clear up because she may need a stronger antibiotic  If your provider decides to give you a prescription, which pharmacy would you like for it to be sent to? [facility]    Patient informed that this information will be sent to the clinical staff for review and that they should receive a follow up call.

## 2022-02-16 NOTE — Telephone Encounter (Signed)
Pt is scheduled for a telephone visit with Dettinger tomorrow. She will come in the morning to leave sample. Future orders placed.

## 2022-02-17 ENCOUNTER — Ambulatory Visit (INDEPENDENT_AMBULATORY_CARE_PROVIDER_SITE_OTHER): Payer: Medicare HMO | Admitting: Family Medicine

## 2022-02-17 ENCOUNTER — Encounter: Payer: Self-pay | Admitting: Family Medicine

## 2022-02-17 DIAGNOSIS — N3000 Acute cystitis without hematuria: Secondary | ICD-10-CM

## 2022-02-17 DIAGNOSIS — R300 Dysuria: Secondary | ICD-10-CM | POA: Diagnosis not present

## 2022-02-17 LAB — URINALYSIS, COMPLETE
Bilirubin, UA: NEGATIVE
Glucose, UA: NEGATIVE
Nitrite, UA: NEGATIVE
Protein,UA: NEGATIVE
Specific Gravity, UA: 1.02 (ref 1.005–1.030)
Urobilinogen, Ur: 0.2 mg/dL (ref 0.2–1.0)
pH, UA: 5.5 (ref 5.0–7.5)

## 2022-02-17 LAB — MICROSCOPIC EXAMINATION: Renal Epithel, UA: NONE SEEN /hpf

## 2022-02-17 MED ORDER — SULFAMETHOXAZOLE-TRIMETHOPRIM 800-160 MG PO TABS: 1.0000 | ORAL_TABLET | Freq: Two times a day (BID) | ORAL | 0 refills | Status: DC

## 2022-02-17 NOTE — Progress Notes (Signed)
Virtual Visit via telephone Note  I connected with Colleen Elliott on 02/17/22 at 1132 by telephone and verified that I am speaking with the correct person using two identifiers. Colleen Elliott is currently located at home and patient are currently with her during visit. The provider, Elige Radon Atzin Buchta, MD is located in their office at time of visit.  Call ended at 1139  I discussed the limitations, risks, security and privacy concerns of performing an evaluation and management service by telephone and the availability of in person appointments. I also discussed with the patient that there may be a patient responsible charge related to this service. The patient expressed understanding and agreed to proceed.   History and Present Illness: Patient is calling in for possible UTI and has frequency and burning and headaches.  She has had them before.  This has been going on for 3 days and is not improving with fluids and cranberry.  She had one recently but before that had been years. This was a few weeks.   Outpatient Encounter Medications as of 02/17/2022  Medication Sig   sulfamethoxazole-trimethoprim (BACTRIM DS) 800-160 MG tablet Take 1 tablet by mouth 2 (two) times daily.   fluticasone (FLONASE) 50 MCG/ACT nasal spray Place 2 sprays into both nostrils daily.   loratadine (CLARITIN) 10 MG tablet Take 1 tablet (10 mg total) by mouth daily.   Multiple Vitamin (MULTIVITAMIN PO) Take by mouth.   No facility-administered encounter medications on file as of 02/17/2022.    Review of Systems  Constitutional:  Negative for chills and fever.  Eyes:  Negative for visual disturbance.  Respiratory:  Negative for chest tightness and shortness of breath.   Cardiovascular:  Negative for chest pain and leg swelling.  Gastrointestinal:  Negative for abdominal pain.  Genitourinary:  Positive for dysuria, frequency and urgency. Negative for difficulty urinating, hematuria, vaginal bleeding,  vaginal discharge and vaginal pain.  Musculoskeletal:  Negative for back pain and gait problem.  Skin:  Negative for rash.  Neurological:  Negative for light-headedness and headaches.  Psychiatric/Behavioral:  Negative for agitation and behavioral problems.   All other systems reviewed and are negative.  Observations/Objective: Patient sounds comfortable and in no acute distress  Urinalysis shows WBC of 6-11, moderate bacteria, yeast present, 0-10 epithelial cells, 0-2 RBCs, 1+ leukocytes and trace ketones and 2+ blood.  Assessment and Plan: Problem List Items Addressed This Visit   None Visit Diagnoses     Acute cystitis without hematuria       Relevant Medications   sulfamethoxazole-trimethoprim (BACTRIM DS) 800-160 MG tablet     We will run culture still, it does seem like she has a UTI based off of symptoms.  We will treat with Bactrim, did Keflex last time.  Follow up plan: Return if symptoms worsen or fail to improve.     I discussed the assessment and treatment plan with the patient. The patient was provided an opportunity to ask questions and all were answered. The patient agreed with the plan and demonstrated an understanding of the instructions.   The patient was advised to call back or seek an in-person evaluation if the symptoms worsen or if the condition fails to improve as anticipated.  The above assessment and management plan was discussed with the patient. The patient verbalized understanding of and has agreed to the management plan. Patient is aware to call the clinic if symptoms persist or worsen. Patient is aware when to return to the clinic for  a follow-up visit. Patient educated on when it is appropriate to go to the emergency department.    I provided 7 minutes of non-face-to-face time during this encounter.    Nils Pyle, MD

## 2022-02-18 DIAGNOSIS — Z8249 Family history of ischemic heart disease and other diseases of the circulatory system: Secondary | ICD-10-CM | POA: Diagnosis not present

## 2022-02-18 DIAGNOSIS — Z87440 Personal history of urinary (tract) infections: Secondary | ICD-10-CM | POA: Diagnosis not present

## 2022-02-18 DIAGNOSIS — R32 Unspecified urinary incontinence: Secondary | ICD-10-CM | POA: Diagnosis not present

## 2022-02-18 DIAGNOSIS — Z881 Allergy status to other antibiotic agents status: Secondary | ICD-10-CM | POA: Diagnosis not present

## 2022-02-19 LAB — URINE CULTURE

## 2022-04-05 ENCOUNTER — Ambulatory Visit (INDEPENDENT_AMBULATORY_CARE_PROVIDER_SITE_OTHER): Payer: Medicare HMO

## 2022-04-05 VITALS — Ht 60.0 in | Wt 128.0 lb

## 2022-04-05 DIAGNOSIS — Z0000 Encounter for general adult medical examination without abnormal findings: Secondary | ICD-10-CM | POA: Diagnosis not present

## 2022-04-05 NOTE — Patient Instructions (Signed)
Ms. Demby , Thank you for taking time to come for your Medicare Wellness Visit. I appreciate your ongoing commitment to your health goals. Please review the following plan we discussed and let me know if I can assist you in the future.   Screening recommendations/referrals: Colonoscopy: Done 05/29/2010 Repeat in 10 years  Mammogram: Done 07/15/2021 Repeat in 1 years  Bone Density: Done 07/15/2021 Repeat every 2 years  Recommended yearly ophthalmology/optometry visit for glaucoma screening and checkup Recommended yearly dental visit for hygiene and checkup  Vaccinations: Influenza vaccine: Available at local pharmacy. Pneumococcal vaccine: Available at [facility]. Tdap vaccine: Done 08/22/2019 Repeat in 10 years  Shingles vaccine: Available at local pharmacy.   Covid-19:Available at local pharmacy.  Advanced directives: Please bring a copy of your health care power of attorney and living will to the office to be added to your chart at your convenience.   Conditions/risks identified: Aim for 30 minutes of exercise or brisk walking, 6-8 glasses of water, and 5 servings of fruits and vegetables each day. KEEP UP THE GOOD WORK!!  Next appointment: Follow up in one year for your annual wellness visit 2024.   Preventive Care 66 Years and Older, Female Preventive care refers to lifestyle choices and visits with your health care provider that can promote health and wellness. What does preventive care include? A yearly physical exam. This is also called an annual well check. Dental exams once or twice a year. Routine eye exams. Ask your health care provider how often you should have your eyes checked. Personal lifestyle choices, including: Daily care of your teeth and gums. Regular physical activity. Eating a healthy diet. Avoiding tobacco and drug use. Limiting alcohol use. Practicing safe sex. Taking low-dose aspirin every day. Taking vitamin and mineral supplements as recommended by your  health care provider. What happens during an annual well check? The services and screenings done by your health care provider during your annual well check will depend on your age, overall health, lifestyle risk factors, and family history of disease. Counseling  Your health care provider may ask you questions about your: Alcohol use. Tobacco use. Drug use. Emotional well-being. Home and relationship well-being. Sexual activity. Eating habits. History of falls. Memory and ability to understand (cognition). Work and work Astronomer. Reproductive health. Screening  You may have the following tests or measurements: Height, weight, and BMI. Blood pressure. Lipid and cholesterol levels. These may be checked every 5 years, or more frequently if you are over 57 years old. Skin check. Lung cancer screening. You may have this screening every year starting at age 62 if you have a 30-pack-year history of smoking and currently smoke or have quit within the past 15 years. Fecal occult blood test (FOBT) of the stool. You may have this test every year starting at age 12. Flexible sigmoidoscopy or colonoscopy. You may have a sigmoidoscopy every 5 years or a colonoscopy every 10 years starting at age 6. Hepatitis C blood test. Hepatitis B blood test. Sexually transmitted disease (STD) testing. Diabetes screening. This is done by checking your blood sugar (glucose) after you have not eaten for a while (fasting). You may have this done every 1-3 years. Bone density scan. This is done to screen for osteoporosis. You may have this done starting at age 60. Mammogram. This may be done every 1-2 years. Talk to your health care provider about how often you should have regular mammograms. Talk with your health care provider about your test results, treatment options, and  if necessary, the need for more tests. Vaccines  Your health care provider may recommend certain vaccines, such as: Influenza vaccine.  This is recommended every year. Tetanus, diphtheria, and acellular pertussis (Tdap, Td) vaccine. You may need a Td booster every 10 years. Zoster vaccine. You may need this after age 66. Pneumococcal 13-valent conjugate (PCV13) vaccine. One dose is recommended after age 25. Pneumococcal polysaccharide (PPSV23) vaccine. One dose is recommended after age 67. Talk to your health care provider about which screenings and vaccines you need and how often you need them. This information is not intended to replace advice given to you by your health care provider. Make sure you discuss any questions you have with your health care provider. Document Released: 10/10/2015 Document Revised: 06/02/2016 Document Reviewed: 07/15/2015 Elsevier Interactive Patient Education  2017 [place] Prevention in the Home Falls can cause injuries. They can happen to people of all ages. There are many things you can do to make your home safe and to help prevent falls. What can I do on the outside of my home? Regularly fix the edges of walkways and driveways and fix any cracks. Remove anything that might make you trip as you walk through a door, such as a raised step or threshold. Trim any bushes or trees on the path to your home. Use bright outdoor lighting. Clear any walking paths of anything that might make someone trip, such as rocks or tools. Regularly check to see if handrails are loose or broken. Make sure that both sides of any steps have handrails. Any raised decks and porches should have guardrails on the edges. Have any leaves, snow, or ice cleared regularly. Use sand or salt on walking paths during winter. Clean up any spills in your garage right away. This includes oil or grease spills. What can I do in the bathroom? Use night lights. Install grab bars by the toilet and in the tub and shower. Do not use towel bars as grab bars. Use non-skid mats or decals in the tub or shower. If you need to sit  down in the shower, use a plastic, non-slip stool. Keep the floor dry. Clean up any water that spills on the floor as soon as it happens. Remove soap buildup in the tub or shower regularly. Attach bath mats securely with double-sided non-slip rug tape. Do not have throw rugs and other things on the floor that can make you trip. What can I do in the bedroom? Use night lights. Make sure that you have a light by your bed that is easy to reach. Do not use any sheets or blankets that are too big for your bed. They should not hang down onto the floor. Have a firm chair that has side arms. You can use this for support while you get dressed. Do not have throw rugs and other things on the floor that can make you trip. What can I do in the kitchen? Clean up any spills right away. Avoid walking on wet floors. Keep items that you use a lot in easy-to-reach places. If you need to reach something above you, use a strong step stool that has a grab bar. Keep electrical cords out of the way. Do not use floor polish or wax that makes floors slippery. If you must use wax, use non-skid floor wax. Do not have throw rugs and other things on the floor that can make you trip. What can I do with my stairs? Do not leave any  items on the stairs. Make sure that there are handrails on both sides of the stairs and use them. Fix handrails that are broken or loose. Make sure that handrails are as long as the stairways. Check any carpeting to make sure that it is firmly attached to the stairs. Fix any carpet that is loose or worn. Avoid having throw rugs at the top or bottom of the stairs. If you do have throw rugs, attach them to the floor with carpet tape. Make sure that you have a light switch at the top of the stairs and the bottom of the stairs. If you do not have them, ask someone to add them for you. What else can I do to help prevent falls? Wear shoes that: Do not have high heels. Have rubber bottoms. Are  comfortable and fit you well. Are closed at the toe. Do not wear sandals. If you use a stepladder: Make sure that it is fully opened. Do not climb a closed stepladder. Make sure that both sides of the stepladder are locked into place. Ask someone to hold it for you, if possible. Clearly mark and make sure that you can see: Any grab bars or handrails. First and last steps. Where the edge of each step is. Use tools that help you move around (mobility aids) if they are needed. These include: Canes. Walkers. Scooters. Crutches. Turn on the lights when you go into a dark area. Replace any light bulbs as soon as they burn out. Set up your furniture so you have a clear path. Avoid moving your furniture around. If any of your floors are uneven, fix them. If there are any pets around you, be aware of where they are. Review your medicines with your doctor. Some medicines can make you feel dizzy. This can increase your chance of falling. Ask your doctor what other things that you can do to help prevent falls. This information is not intended to replace advice given to you by your health care provider. Make sure you discuss any questions you have with your health care provider. Document Released: 07/10/2009 Document Revised: 02/19/2016 Document Reviewed: 10/18/2014 Elsevier Interactive Patient Education  2017 [employer].

## 2022-04-05 NOTE — Progress Notes (Signed)
Subjective:   Colleen Elliott is a 66 y.o. female who presents for an Initial Medicare Annual Wellness Visit. Virtual Visit via Telephone Note  I connected with  Colleen Elliott on 04/05/22 at 12:00 PM EDT by telephone and verified that I am speaking with the correct person using two identifiers.  Location: Patient: HOME Provider: [facility] Persons participating in the virtual visit: patient/Nurse Health Advisor   I discussed the limitations, risks, security and privacy concerns of performing an evaluation and management service by telephone and the availability of in person appointments. The patient expressed understanding and agreed to proceed.  Interactive audio and video telecommunications were attempted between this nurse and patient, however failed, due to patient having technical difficulties OR patient did not have access to video capability.  We continued and completed visit with audio only.  Some vital signs may be absent or patient reported.   Colleen Dash, LPN  Review of Systems     Cardiac Risk Factors include: advanced age (>3men, >44 women);dyslipidemia;sedentary lifestyle     Objective:    Today's Vitals   04/05/22 1159  Weight: 128 lb (58.1 kg)  Height: 5' (1.524 m)   Body mass index is 25 kg/m.     04/05/2022   12:04 PM 03/02/2012    6:15 AM  Advanced Directives  Does Patient Have a Medical Advance Directive? Yes Patient does not have advance directive  Type of Advance Directive Healthcare Power of [place];Living will   Copy of Healthcare Power of Attorney in Chart? No - copy requested   Pre-existing out of facility DNR order (yellow form or pink MOST form)  No    Current Medications (verified) Outpatient Encounter Medications as of 04/05/2022  Medication Sig   fluticasone (FLONASE) 50 MCG/ACT nasal spray Place 2 sprays into both nostrils daily.   loratadine (CLARITIN) 10 MG tablet Take 1 tablet (10 mg total) by mouth daily.   Multiple  Vitamin (MULTIVITAMIN PO) Take by mouth.   sulfamethoxazole-trimethoprim (BACTRIM DS) 800-160 MG tablet Take 1 tablet by mouth 2 (two) times daily. (Patient not taking: Reported on 04/05/2022)   No facility-administered encounter medications on file as of 04/05/2022.    Allergies (verified) Ciprofloxacin   History: Past Medical History:  Diagnosis Date   Allergy    seasonal   Amenorrhea    Anemia    Glaucoma    trace   Hematuria    negative work up   Kidney stone    No pertinent past medical history    Osteoporosis    Pure hypercholesterolemia 01/21/2022   Past Surgical History:  Procedure Laterality Date   TUBAL LIGATION     Family History  Problem Relation Age of Onset   Arthritis Mother    Heart disease Father    Diabetes Paternal Aunt    Heart disease Paternal Aunt    Hyperlipidemia Brother    Uterine cancer Maternal Aunt    Ovarian cancer Maternal Aunt    Heart disease Paternal Uncle    Social History   Socioeconomic History   Marital status: Married    Spouse name: Not on file   Number of children: Not on file   Years of education: Not on file   Highest education level: Not on file  Occupational History   Not on file  Tobacco Use   Smoking status: Never   Smokeless tobacco: Never  Vaping Use   Vaping Use: Never used  Substance and Sexual Activity  Alcohol use: Yes    Alcohol/week: 1.0 standard drink of alcohol    Types: 1 Standard drinks or equivalent per week   Drug use: No   Sexual activity: Yes    Partners: Male    Birth control/protection: Surgical    Comment: tubal ligation  Other Topics Concern   Not on file  Social History Narrative   Not on file   Social Determinants of Health   Financial Resource Strain: Low Risk  (04/05/2022)   Overall Financial Resource Strain (CARDIA)    Difficulty of Paying Living Expenses: Not hard at all  Food Insecurity: No Food Insecurity (04/05/2022)   Hunger Vital Sign    Worried About Running Out of  Food in the Last Year: Never true    Ran Out of Food in the Last Year: Never true  Transportation Needs: No Transportation Needs (04/05/2022)   PRAPARE - Administrator, Civil Service (Medical): No    Lack of Transportation (Non-Medical): No  Physical Activity: Insufficiently Active (04/05/2022)   Exercise Vital Sign    Days of Exercise per Week: 3 days    Minutes of Exercise per Session: 30 min  Stress: No Stress Concern Present (04/05/2022)   [employer] of Occupational Health - Occupational Stress Questionnaire    Feeling of Stress : Not at all  Social Connections: Socially Integrated (04/05/2022)   Social Connection and Isolation Panel [NHANES]    Frequency of Communication with Friends and Family: More than three times a week    Frequency of Social Gatherings with Friends and Family: More than three times a week    Attends Religious Services: More than 4 times per year    Active Member of [employer] or Organizations: Yes    Attends Engineer, structural: More than 4 times per year    Marital Status: Married    Tobacco Counseling Counseling given: Not Answered   Clinical Intake:  Pre-visit preparation completed: Yes  Pain : No/denies pain     BMI - recorded: 25 Nutritional Status: BMI 25 -29 Overweight Nutritional Risks: None Diabetes: No  How often do you need to have someone help you when you read instructions, pamphlets, or other written materials from your doctor or pharmacy?: 1 - Never  Diabetic?NO  Interpreter Needed?: No  Information entered by :: mj Jolin Benavides, lpn   Activities of Daily Living    04/05/2022   12:07 PM  In your present state of health, do you have any difficulty performing the following activities:  Hearing? 0  Vision? 0  Difficulty concentrating or making decisions? 0  Walking or climbing stairs? 0  Dressing or bathing? 0  Doing errands, shopping? 0  Preparing Food and eating ? N  Using the Toilet? N  In the past  six months, have you accidently leaked urine? N  Do you have problems with loss of bowel control? N  Managing your Medications? N  Managing your Finances? N  Housekeeping or managing your Housekeeping? N    Patient Care Team: Dettinger, Elige Radon, MD as PCP - General (Family Medicine) Romualdo Bolk, MD as Consulting Physician (Obstetrics and Gynecology)  Indicate any recent Medical Services you may have received from other than [facility] providers in the past year (date may be approximate).     Assessment:   This is a routine wellness examination for [facility].  Hearing/Vision screen Hearing Screening - Comments:: No hearing issues.  Vision Screening - Comments:: Glasses. [facility] 12/2021.  Dietary issues and exercise activities discussed: Current Exercise Habits: Home exercise routine, Type of exercise: walking, Time (Minutes): 30, Frequency (Times/Week): 3, Weekly Exercise (Minutes/Week): 90, Intensity: Mild, Exercise limited by: cardiac condition(s)   Goals Addressed             This Visit's Progress    Exercise 3x per week (30 min per time)       Increase exercise. Pt states she would like to lose some weight.        Depression Screen    04/05/2022   12:03 PM 01/21/2022    3:42 PM 10/06/2017    2:43 PM 09/12/2017    3:06 PM 08/30/2017    3:56 PM 11/26/2016    3:34 PM 05/12/2015    1:34 PM  PHQ 2/9 Scores  PHQ - 2 Score 0 0 0 0 1 0 0  PHQ- 9 Score  0         Fall Risk    04/05/2022   12:05 PM 10/06/2017    2:43 PM 09/12/2017    3:06 PM 08/30/2017    3:56 PM 11/26/2016    3:34 PM  Fall Risk   Falls in the past year? 0 No No No No  Number falls in past yr: 0      Injury with Fall? 0      Risk for fall due to : No Fall Risks      Follow up Falls prevention discussed        FALL RISK PREVENTION PERTAINING TO THE HOME:  Any stairs in or around the home? Yes  If so, are there any without handrails? No  Home free of loose throw rugs in walkways, pet beds,  electrical cords, etc? Yes  Adequate lighting in your home to reduce risk of falls? Yes   ASSISTIVE DEVICES UTILIZED TO PREVENT FALLS:  Life alert? No  Use of a cane, walker or w/c? No  Grab bars in the bathroom? Yes  Shower chair or bench in shower? No  Elevated toilet seat or a handicapped toilet? Yes   TIMED UP AND GO:  Was the test performed? No .PHONE VISIT.  Cognitive Function:        04/05/2022   12:07 PM  6CIT Screen  What Year? 0 points  What month? 0 points  What time? 0 points  Count back from 20 0 points  Months in reverse 0 points  Repeat phrase 0 points  Total Score 0 points    Immunizations Immunization History  Administered Date(s) Administered   Tdap 08/22/2019    TDAP status: Up to date  Flu Vaccine status: Declined, Education has been provided regarding the importance of this vaccine but patient still declined. Advised may receive this vaccine at local pharmacy or Health Dept. Aware to provide a copy of the vaccination record if obtained from local pharmacy or Health Dept. Verbalized acceptance and understanding.  Pneumococcal vaccine status: Declined,  Education has been provided regarding the importance of this vaccine but patient still declined. Advised may receive this vaccine at local pharmacy or Health Dept. Aware to provide a copy of the vaccination record if obtained from local pharmacy or Health Dept. Verbalized acceptance and understanding.   Covid-19 vaccine status: Declined, Education has been provided regarding the importance of this vaccine but patient still declined. Advised may receive this vaccine at local pharmacy or Health Dept.or vaccine clinic. Aware to provide a copy of the vaccination record if obtained from local pharmacy or  Health Dept. Verbalized acceptance and understanding.  Qualifies for Shingles Vaccine? Yes   Zostavax completed No   Shingrix Completed?: No.    Education has been provided regarding the importance of this  vaccine. Patient has been advised to call insurance company to determine out of pocket expense if they have not yet received this vaccine. Advised may also receive vaccine at local pharmacy or Health Dept. Verbalized acceptance and understanding.  Screening Tests Health Maintenance  Topic Date Due   Zoster Vaccines- Shingrix (1 of 2) 04/22/2022 (Originally 03/29/2006)   COVID-19 Vaccine (1) 09/24/2022 (Originally 09/29/1956)   Pneumonia Vaccine 70+ Years old (1 - PCV) 01/22/2023 (Originally 03/29/2021)   COLONOSCOPY (Pts 45-14yrs Insurance coverage will need to be confirmed)  01/22/2023 (Originally 05/29/2020)   INFLUENZA VACCINE  04/27/2022   MAMMOGRAM  07/16/2023   TETANUS/TDAP  08/21/2029   DEXA SCAN  Completed   Hepatitis C Screening  Completed   HPV VACCINES  Aged Out    Health Maintenance  There are no preventive care reminders to display for this patient.   Colorectal cancer screening: Type of screening: Colonoscopy. Completed 05/29/2010. Repeat every 10 years  Mammogram status: Completed 07/15/2021. Repeat every year  Bone Density status: Completed 07/15/2021. Results reflect: Bone density results: NORMAL. Repeat every 2 years.  Lung Cancer Screening: (Low Dose CT Chest recommended if Age 57-80 years, 30 pack-year currently smoking OR have quit w/in 15years.) does not qualify.    Additional Screening:  Hepatitis C Screening: does qualify; Completed 03/02/2012  Vision Screening: Recommended annual ophthalmology exams for early detection of glaucoma and other disorders of the eye. Is the patient up to date with their annual eye exam?  Yes  Who is the provider or what is the name of the office in which the patient attends annual eye exams? [facility] If pt is not established with a provider, would they like to be referred to a provider to establish care? No .   Dental Screening: Recommended annual dental exams for proper oral hygiene  Community Resource Referral / Chronic  Care Management: CRR required this visit?  No   CCM required this visit?  No      Plan:     I have personally reviewed and noted the following in the patient's chart:   Medical and social history Use of alcohol, tobacco or illicit drugs  Current medications and supplements including opioid prescriptions. Patient is not currently taking opioid prescriptions. Functional ability and status Nutritional status Physical activity Advanced directives List of other physicians Hospitalizations, surgeries, and ER visits in previous 12 months Vitals Screenings to include cognitive, depression, and falls Referrals and appointments  In addition, I have reviewed and discussed with patient certain preventive protocols, quality metrics, and best practice recommendations. A written personalized care plan for preventive services as well as general preventive health recommendations were provided to patient.     Colleen Dash, LPN   [id number]/98/1191   Nurse Notes: Discussed Colonoscopy. Pt states she will set up for this year. Pt declined vaccines.

## 2022-08-30 DIAGNOSIS — H5213 Myopia, bilateral: Secondary | ICD-10-CM | POA: Diagnosis not present

## 2022-08-30 DIAGNOSIS — H10413 Chronic giant papillary conjunctivitis, bilateral: Secondary | ICD-10-CM | POA: Diagnosis not present

## 2022-08-30 DIAGNOSIS — H524 Presbyopia: Secondary | ICD-10-CM | POA: Diagnosis not present

## 2022-08-30 DIAGNOSIS — H40053 Ocular hypertension, bilateral: Secondary | ICD-10-CM | POA: Diagnosis not present

## 2022-08-30 DIAGNOSIS — H52223 Regular astigmatism, bilateral: Secondary | ICD-10-CM | POA: Diagnosis not present

## 2022-08-30 DIAGNOSIS — H21233 Degeneration of iris (pigmentary), bilateral: Secondary | ICD-10-CM | POA: Diagnosis not present

## 2022-08-30 DIAGNOSIS — H2513 Age-related nuclear cataract, bilateral: Secondary | ICD-10-CM | POA: Diagnosis not present

## 2022-10-08 ENCOUNTER — Other Ambulatory Visit: Payer: Self-pay | Admitting: Obstetrics and Gynecology

## 2022-10-08 DIAGNOSIS — Z1231 Encounter for screening mammogram for malignant neoplasm of breast: Secondary | ICD-10-CM

## 2022-10-13 ENCOUNTER — Ambulatory Visit
Admission: RE | Admit: 2022-10-13 | Discharge: 2022-10-13 | Disposition: A | Discharge status: Home / Self Care | Payer: Medicare HMO | Source: Ambulatory Visit | Attending: Obstetrics and Gynecology | Admitting: Obstetrics and Gynecology

## 2022-10-13 DIAGNOSIS — Z1231 Encounter for screening mammogram for malignant neoplasm of breast: Secondary | ICD-10-CM | POA: Diagnosis not present

## 2022-10-27 DIAGNOSIS — H52223 Regular astigmatism, bilateral: Secondary | ICD-10-CM | POA: Diagnosis not present

## 2022-10-27 DIAGNOSIS — H524 Presbyopia: Secondary | ICD-10-CM | POA: Diagnosis not present

## 2023-03-07 DIAGNOSIS — H2513 Age-related nuclear cataract, bilateral: Secondary | ICD-10-CM | POA: Diagnosis not present

## 2023-03-07 DIAGNOSIS — H10413 Chronic giant papillary conjunctivitis, bilateral: Secondary | ICD-10-CM | POA: Diagnosis not present

## 2023-03-07 DIAGNOSIS — H21233 Degeneration of iris (pigmentary), bilateral: Secondary | ICD-10-CM | POA: Diagnosis not present

## 2023-03-07 DIAGNOSIS — H40053 Ocular hypertension, bilateral: Secondary | ICD-10-CM | POA: Diagnosis not present

## 2023-03-18 DIAGNOSIS — Z791 Long term (current) use of non-steroidal anti-inflammatories (NSAID): Secondary | ICD-10-CM | POA: Diagnosis not present

## 2023-03-18 DIAGNOSIS — Z8249 Family history of ischemic heart disease and other diseases of the circulatory system: Secondary | ICD-10-CM | POA: Diagnosis not present

## 2023-03-18 DIAGNOSIS — Z8261 Family history of arthritis: Secondary | ICD-10-CM | POA: Diagnosis not present

## 2023-03-18 DIAGNOSIS — M810 Age-related osteoporosis without current pathological fracture: Secondary | ICD-10-CM | POA: Diagnosis not present

## 2023-03-18 DIAGNOSIS — H269 Unspecified cataract: Secondary | ICD-10-CM | POA: Diagnosis not present

## 2023-04-08 ENCOUNTER — Ambulatory Visit (INDEPENDENT_AMBULATORY_CARE_PROVIDER_SITE_OTHER): Payer: Medicare HMO

## 2023-04-08 VITALS — Ht 60.0 in | Wt 128.0 lb

## 2023-04-08 DIAGNOSIS — Z1211 Encounter for screening for malignant neoplasm of colon: Secondary | ICD-10-CM | POA: Diagnosis not present

## 2023-04-08 DIAGNOSIS — Z0000 Encounter for general adult medical examination without abnormal findings: Secondary | ICD-10-CM | POA: Diagnosis not present

## 2023-04-08 NOTE — Patient Instructions (Signed)
Colleen Elliott , Thank you for taking time to come for your Medicare Wellness Visit. I appreciate your ongoing commitment to your health goals. Please review the following plan we discussed and let me know if I can assist you in the future.   These are the goals we discussed:  Goals      Exercise 3x per week (30 min per time)     Increase exercise. Pt states she would like to lose some weight.         This is a list of the screening recommended for you and due dates:  Health Maintenance  Topic Date Due   Zoster (Shingles) Vaccine (1 of 2) Never done   Colon Cancer Screening  05/29/2020   Pneumonia Vaccine (1 of 1 - PCV) Never done   COVID-19 Vaccine (1 - 2023-24 season) Never done   Flu Shot  04/28/2023   Medicare Annual Wellness Visit  04/07/2024   Mammogram  10/13/2024   DTaP/Tdap/Td vaccine (2 - Td or Tdap) 08/21/2029   DEXA scan (bone density measurement)  Completed   Hepatitis C Screening  Completed   HPV Vaccine  Aged Out    Advanced directives: Please bring a copy of your health care power of attorney and living will to the office to be added to your chart at your convenience.   Conditions/risks identified: Aim for 30 minutes of exercise or brisk walking, 6-8 glasses of water, and 5 servings of fruits and vegetables each day.   Next appointment: Follow up in one year for your annual wellness visit.   Preventive Care 67 Years and Older, Female  Preventive care refers to lifestyle choices and visits with your health care provider that can promote health and wellness. What does preventive care include? A yearly physical exam. This is also called an annual well check. Dental exams once or twice a year. Routine eye exams. Ask your health care provider how often you should have your eyes checked. Personal lifestyle choices, including: Daily care of your teeth and gums. Regular physical activity. Eating a healthy diet. Avoiding tobacco and drug use. Limiting alcohol  use. Practicing safe sex. Taking low doses of aspirin every day. Taking vitamin and mineral supplements as recommended by your health care provider. What happens during an annual well check? The services and screenings done by your health care provider during your annual well check will depend on your age, overall health, lifestyle risk factors, and family history of disease. Counseling  Your health care provider may ask you questions about your: Alcohol use. Tobacco use. Drug use. Emotional well-being. Home and relationship well-being. Sexual activity. Eating habits. History of falls. Memory and ability to understand (cognition). Work and work Astronomer. Screening  You may have the following tests or measurements: Height, weight, and BMI. Blood pressure. Lipid and cholesterol levels. These may be checked every 5 years, or more frequently if you are over 46 years old. Skin check. Lung cancer screening. You may have this screening every year starting at age 35 if you have a 30-pack-year history of smoking and currently smoke or have quit within the past 15 years. Fecal occult blood test (FOBT) of the stool. You may have this test every year starting at age 48. Flexible sigmoidoscopy or colonoscopy. You may have a sigmoidoscopy every 5 years or a colonoscopy every 10 years starting at age 50. Prostate cancer screening. Recommendations will vary depending on your family history and other risks. Hepatitis C blood test. Hepatitis B blood  test. Sexually transmitted disease (STD) testing. Diabetes screening. This is done by checking your blood sugar (glucose) after you have not eaten for a while (fasting). You may have this done every 1-3 years. Abdominal aortic aneurysm (AAA) screening. You may need this if you are a current or former smoker. Osteoporosis. You may be screened starting at age 72 if you are at high risk. Talk with your health care provider about your test results,  treatment options, and if necessary, the need for more tests. Vaccines  Your health care provider may recommend certain vaccines, such as: Influenza vaccine. This is recommended every year. Tetanus, diphtheria, and acellular pertussis (Tdap, Td) vaccine. You may need a Td booster every 10 years. Zoster vaccine. You may need this after age 88. Pneumococcal 13-valent conjugate (PCV13) vaccine. One dose is recommended after age 67. Pneumococcal polysaccharide (PPSV23) vaccine. One dose is recommended after age 67. Talk to your health care provider about which screenings and vaccines you need and how often you need them. This information is not intended to replace advice given to you by your health care provider. Make sure you discuss any questions you have with your health care provider. Document Released: 10/10/2015 Document Revised: 06/02/2016 Document Reviewed: 07/15/2015 Elsevier Interactive Patient Education  2017 [employer].  Fall Prevention in the Home Falls can cause injuries. They can happen to people of all ages. There are many things you can do to make your home safe and to help prevent falls. What can I do on the outside of my home? Regularly fix the edges of walkways and driveways and fix any cracks. Remove anything that might make you trip as you walk through a door, such as a raised step or threshold. Trim any bushes or trees on the path to your home. Use bright outdoor lighting. Clear any walking paths of anything that might make someone trip, such as rocks or tools. Regularly check to see if handrails are loose or broken. Make sure that both sides of any steps have handrails. Any raised decks and porches should have guardrails on the edges. Have any leaves, snow, or ice cleared regularly. Use sand or salt on walking paths during winter. Clean up any spills in your garage right away. This includes oil or grease spills. What can I do in the bathroom? Use night  lights. Install grab bars by the toilet and in the tub and shower. Do not use towel bars as grab bars. Use non-skid mats or decals in the tub or shower. If you need to sit down in the shower, use a plastic, non-slip stool. Keep the floor dry. Clean up any water that spills on the floor as soon as it happens. Remove soap buildup in the tub or shower regularly. Attach bath mats securely with double-sided non-slip rug tape. Do not have throw rugs and other things on the floor that can make you trip. What can I do in the bedroom? Use night lights. Make sure that you have a light by your bed that is easy to reach. Do not use any sheets or blankets that are too big for your bed. They should not hang down onto the floor. Have a firm chair that has side arms. You can use this for support while you get dressed. Do not have throw rugs and other things on the floor that can make you trip. What can I do in the kitchen? Clean up any spills right away. Avoid walking on wet floors. Keep items that  you use a lot in easy-to-reach places. If you need to reach something above you, use a strong step stool that has a grab bar. Keep electrical cords out of the way. Do not use floor polish or wax that makes floors slippery. If you must use wax, use non-skid floor wax. Do not have throw rugs and other things on the floor that can make you trip. What can I do with my stairs? Do not leave any items on the stairs. Make sure that there are handrails on both sides of the stairs and use them. Fix handrails that are broken or loose. Make sure that handrails are as long as the stairways. Check any carpeting to make sure that it is firmly attached to the stairs. Fix any carpet that is loose or worn. Avoid having throw rugs at the top or bottom of the stairs. If you do have throw rugs, attach them to the floor with carpet tape. Make sure that you have a light switch at the top of the stairs and the bottom of the stairs. If  you do not have them, ask someone to add them for you. What else can I do to help prevent falls? Wear shoes that: Do not have high heels. Have rubber bottoms. Are comfortable and fit you well. Are closed at the toe. Do not wear sandals. If you use a stepladder: Make sure that it is fully opened. Do not climb a closed stepladder. Make sure that both sides of the stepladder are locked into place. Ask someone to hold it for you, if possible. Clearly mark and make sure that you can see: Any grab bars or handrails. First and last steps. Where the edge of each step is. Use tools that help you move around (mobility aids) if they are needed. These include: Canes. Walkers. Scooters. Crutches. Turn on the lights when you go into a dark area. Replace any light bulbs as soon as they burn out. Set up your furniture so you have a clear path. Avoid moving your furniture around. If any of your floors are uneven, fix them. If there are any pets around you, be aware of where they are. Review your medicines with your doctor. Some medicines can make you feel dizzy. This can increase your chance of falling. Ask your doctor what other things that you can do to help prevent falls. This information is not intended to replace advice given to you by your health care provider. Make sure you discuss any questions you have with your health care provider. Document Released: 07/10/2009 Document Revised: 02/19/2016 Document Reviewed: 10/18/2014 Elsevier Interactive Patient Education  2017 [employer].

## 2023-04-08 NOTE — Progress Notes (Signed)
Subjective:   Colleen Elliott is a 68 y.o. female who presents for Medicare Annual (Subsequent) preventive examination.  Visit Complete: Virtual  I connected with  [name] on 04/08/23 by a audio enabled telemedicine application and verified that I am speaking with the correct person using two identifiers.  Patient Location: Home  Provider Location: Home Office  I discussed the limitations of evaluation and management by telemedicine. The patient expressed understanding and agreed to proceed.  Patient Medicare AWV questionnaire was completed by the patient on 04/08/2023; I have confirmed that all information answered by patient is correct and no changes since this date.  Review of Systems     Cardiac Risk Factors include: advanced age (>36men, >30 women)     Objective:    Today's Vitals   04/08/23 1153  Weight: 128 lb (58.1 kg)  Height: 5' (1.524 m)   Body mass index is 25 kg/m.     04/08/2023   11:56 AM 04/05/2022   12:04 PM 03/02/2012    6:15 AM  Advanced Directives  Does Patient Have a Medical Advance Directive? Yes Yes Patient does not have advance directive  Type of Advance Directive Healthcare Power of [place];Living will Healthcare Power of [place];Living will   Copy of Healthcare Power of Attorney in Chart? No - copy requested No - copy requested   Pre-existing out of facility DNR order (yellow form or pink MOST form)   No    Current Medications (verified) Outpatient Encounter Medications as of 04/08/2023  Medication Sig   fluticasone (FLONASE) 50 MCG/ACT nasal spray Place 2 sprays into both nostrils daily.   loratadine (CLARITIN) 10 MG tablet Take 1 tablet (10 mg total) by mouth daily.   Multiple Vitamin (MULTIVITAMIN PO) Take by mouth.   sulfamethoxazole-trimethoprim (BACTRIM DS) 800-160 MG tablet Take 1 tablet by mouth 2 (two) times daily. (Patient not taking: Reported on 04/08/2023)   No facility-administered encounter medications on file as  of 04/08/2023.    Allergies (verified) Ciprofloxacin   History: Past Medical History:  Diagnosis Date   Allergy    seasonal   Amenorrhea    Anemia    Glaucoma    trace   Hematuria    negative work up   Kidney stone    No pertinent past medical history    Osteoporosis    Pure hypercholesterolemia 01/21/2022   Past Surgical History:  Procedure Laterality Date   TUBAL LIGATION     Family History  Problem Relation Age of Onset   Arthritis Mother    Heart disease Father    Diabetes Paternal Aunt    Heart disease Paternal Aunt    Hyperlipidemia Brother    Uterine cancer Maternal Aunt    Ovarian cancer Maternal Aunt    Heart disease Paternal Uncle    Social History   Socioeconomic History   Marital status: Married    Spouse name: Not on file   Number of children: Not on file   Years of education: Not on file   Highest education level: Not on file  Occupational History   Not on file  Tobacco Use   Smoking status: Never   Smokeless tobacco: Never  Vaping Use   Vaping status: Never Used  Substance and Sexual Activity   Alcohol use: Yes    Alcohol/week: 1.0 standard drink of alcohol    Types: 1 Standard drinks or equivalent per week   Drug use: No   Sexual activity: Yes  Partners: Male    Birth control/protection: Surgical    Comment: tubal ligation  Other Topics Concern   Not on file  Social History Narrative   Not on file   Social Determinants of Health   Financial Resource Strain: Low Risk  (04/08/2023)   Overall Financial Resource Strain (CARDIA)    Difficulty of Paying Living Expenses: Not hard at all  Food Insecurity: No Food Insecurity (04/08/2023)   Hunger Vital Sign    Worried About Running Out of Food in the Last Year: Never true    Ran Out of Food in the Last Year: Never true  Transportation Needs: No Transportation Needs (04/08/2023)   PRAPARE - Administrator, Civil Service (Medical): No    Lack of Transportation (Non-Medical):  No  Physical Activity: Insufficiently Active (04/08/2023)   Exercise Vital Sign    Days of Exercise per Week: 3 days    Minutes of Exercise per Session: 30 min  Stress: No Stress Concern Present (04/08/2023)   [employer] of Occupational Health - Occupational Stress Questionnaire    Feeling of Stress : Not at all  Social Connections: Moderately Integrated (04/08/2023)   Social Connection and Isolation Panel [NHANES]    Frequency of Communication with Friends and Family: More than three times a week    Frequency of Social Gatherings with Friends and Family: More than three times a week    Attends Religious Services: More than 4 times per year    Active Member of [employer] or Organizations: No    Attends Engineer, structural: Never    Marital Status: Married    Tobacco Counseling Counseling given: Not Answered   Clinical Intake:  Pre-visit preparation completed: Yes  Pain : No/denies painPer patient no change in vitals since last visit, unable to obtain new vitals due to telehealth visit      Nutritional Risks: None Diabetes: No  How often do you need to have someone help you when you read instructions, pamphlets, or other written materials from your doctor or pharmacy?: 1 - Never  Interpreter Needed?: No  Information entered by :: Renie Ora, LPN   Activities of Daily Living    04/08/2023   11:56 AM  In your present state of health, do you have any difficulty performing the following activities:  Hearing? 0  Vision? 0  Difficulty concentrating or making decisions? 0  Walking or climbing stairs? 0  Dressing or bathing? 0  Doing errands, shopping? 0  Preparing Food and eating ? N  Using the Toilet? N  In the past six months, have you accidently leaked urine? N  Do you have problems with loss of bowel control? N  Managing your Medications? N  Managing your Finances? N  Housekeeping or managing your Housekeeping? N    Patient Care Team: Dettinger,  Elige Radon, MD as PCP - General (Family Medicine) Romualdo Bolk, MD (Inactive) as Consulting Physician (Obstetrics and Gynecology)  Indicate any recent Medical Services you may have received from other than [facility] providers in the past year (date may be approximate).     Assessment:   This is a routine wellness examination for [facility].  Hearing/Vision screen Vision Screening - Comments:: Wears rx glasses - up to date with routine eye exams with  Dr.Parker   Dietary issues and exercise activities discussed:     Goals Addressed             This Visit's Progress    Exercise 3x  per week (30 min per time)   On track    Increase exercise. Pt states she would like to lose some weight.        Depression Screen    04/08/2023   11:55 AM 04/05/2022   12:03 PM 01/21/2022    3:42 PM 10/06/2017    2:43 PM 09/12/2017    3:06 PM 08/30/2017    3:56 PM 11/26/2016    3:34 PM  PHQ 2/9 Scores  PHQ - 2 Score 0 0 0 0 0 1 0  PHQ- 9 Score   0        Fall Risk    04/08/2023   11:54 AM 04/05/2022   12:05 PM 10/06/2017    2:43 PM 09/12/2017    3:06 PM 08/30/2017    3:56 PM  Fall Risk   Falls in the past year? 0 0 No No No  Number falls in past yr: 0 0     Injury with Fall? 0 0     Risk for fall due to : No Fall Risks No Fall Risks     Follow up Falls prevention discussed Falls prevention discussed       MEDICARE RISK AT HOME:  Medicare Risk at Home - 04/08/23 1154     Any stairs in or around the home? No    If so, are there any without handrails? No    Home free of loose throw rugs in walkways, pet beds, electrical cords, etc? Yes    Adequate lighting in your home to reduce risk of falls? Yes    Life alert? No    Use of a cane, walker or w/c? No    Grab bars in the bathroom? Yes    Shower chair or bench in shower? Yes    Elevated toilet seat or a handicapped toilet? Yes             TIMED UP AND GO:  Was the test performed?  No    Cognitive Function:        04/08/2023    11:56 AM 04/05/2022   12:07 PM  6CIT Screen  What Year? 0 points 0 points  What month? 0 points 0 points  What time? 0 points 0 points  Count back from 20 0 points 0 points  Months in reverse 0 points 0 points  Repeat phrase 0 points 0 points  Total Score 0 points 0 points    Immunizations Immunization History  Administered Date(s) Administered   Tdap 08/22/2019    TDAP status: Up to date  Flu Vaccine status: Declined, Education has been provided regarding the importance of this vaccine but patient still declined. Advised may receive this vaccine at local pharmacy or Health Dept. Aware to provide a copy of the vaccination record if obtained from local pharmacy or Health Dept. Verbalized acceptance and understanding.  Pneumococcal vaccine status: Declined,  Education has been provided regarding the importance of this vaccine but patient still declined. Advised may receive this vaccine at local pharmacy or Health Dept. Aware to provide a copy of the vaccination record if obtained from local pharmacy or Health Dept. Verbalized acceptance and understanding.   Covid-19 vaccine status: Declined, Education has been provided regarding the importance of this vaccine but patient still declined. Advised may receive this vaccine at local pharmacy or Health Dept.or vaccine clinic. Aware to provide a copy of the vaccination record if obtained from local pharmacy or Health Dept. Verbalized acceptance and understanding.  Qualifies  for Shingles Vaccine? Yes   Zostavax completed No   Shingrix Completed?: No.    Education has been provided regarding the importance of this vaccine. Patient has been advised to call insurance company to determine out of pocket expense if they have not yet received this vaccine. Advised may also receive vaccine at local pharmacy or Health Dept. Verbalized acceptance and understanding.  Screening Tests Health Maintenance  Topic Date Due   Zoster Vaccines- Shingrix (1 of 2)  Never done   Colonoscopy  05/29/2020   Pneumonia Vaccine 94+ Years old (1 of 1 - PCV) Never done   COVID-19 Vaccine (1 - 2023-24 season) Never done   INFLUENZA VACCINE  04/28/2023   Medicare Annual Wellness (AWV)  04/07/2024   MAMMOGRAM  10/13/2024   DTaP/Tdap/Td (2 - Td or Tdap) 08/21/2029   DEXA SCAN  Completed   Hepatitis C Screening  Completed   HPV VACCINES  Aged Out    Health Maintenance  Health Maintenance Due  Topic Date Due   Zoster Vaccines- Shingrix (1 of 2) Never done   Colonoscopy  05/29/2020   Pneumonia Vaccine 57+ Years old (1 of 1 - PCV) Never done   COVID-19 Vaccine (1 - 2023-24 season) Never done    Colorectal cancer screening: Referral to GI placed 04/08/2023. Pt aware the office will call re: appt.  Mammogram status: Completed 10/13/2022. Repeat every year  Bone Density status: Completed 07/15/2021. Results reflect: Bone density results: OSTEOPENIA. Repeat every 5 years.  Lung Cancer Screening: (Low Dose CT Chest recommended if Age 72-80 years, 20 pack-year currently smoking OR have quit w/in 15years.) does not qualify.   Lung Cancer Screening Referral: n/a  Additional Screening:  Hepatitis C Screening: does not qualify; Completed 10/03/2011  Vision Screening: Recommended annual ophthalmology exams for early detection of glaucoma and other disorders of the eye. Is the patient up to date with their annual eye exam?  Yes  Who is the provider or what is the name of the office in which the patient attends annual eye exams? Dr.Parker  If pt is not established with a provider, would they like to be referred to a provider to establish care? No .   Dental Screening: Recommended annual dental exams for proper oral hygiene    Community Resource Referral / Chronic Care Management: CRR required this visit?  No   CCM required this visit?  No     Plan:     I have personally reviewed and noted the following in the patient's chart:   Medical and social  history Use of alcohol, tobacco or illicit drugs  Current medications and supplements including opioid prescriptions. Patient is not currently taking opioid prescriptions. Functional ability and status Nutritional status Physical activity Advanced directives List of other physicians Hospitalizations, surgeries, and ER visits in previous 12 months Vitals Screenings to include cognitive, depression, and falls Referrals and appointments  In addition, I have reviewed and discussed with patient certain preventive protocols, quality metrics, and best practice recommendations. A written personalized care plan for preventive services as well as general preventive health recommendations were provided to patient.     Lorrene Reid, LPN   6/96/2952   After Visit Summary: (MyChart) Due to this being a telephonic visit, the after visit summary with patients personalized plan was offered to patient via MyChart   Nurse Notes: none

## 2023-06-06 ENCOUNTER — Ambulatory Visit (AMBULATORY_SURGERY_CENTER): Payer: Medicare HMO

## 2023-06-06 ENCOUNTER — Encounter: Payer: Self-pay | Admitting: Internal Medicine

## 2023-06-06 VITALS — Ht 60.0 in | Wt 130.8 lb

## 2023-06-06 DIAGNOSIS — Z1211 Encounter for screening for malignant neoplasm of colon: Secondary | ICD-10-CM

## 2023-06-06 NOTE — Progress Notes (Signed)
No egg or soy allergy known to patient  No issues known to pt with past sedation with any surgeries or procedures Patient denies ever being told they had issues or difficulty with intubation  No FH of Malignant Hyperthermia Pt is not on diet pills Pt is not on  home 02  Pt is not on blood thinners  Pt denies issues with constipation   No A fib or A flutter Have any cardiac testing pending--no LOA: independent  Prep: spilt dose miralax   Patient's chart reviewed by Cathlyn Parsons CNRA prior to previsit and patient appropriate for the [facility].  Previsit completed and red dot placed by patient's name on their procedure day (on provider's schedule).     PV competed with patient. Prep instructions sent via mychart hard copy given at [facility]

## 2023-06-22 ENCOUNTER — Ambulatory Visit (AMBULATORY_SURGERY_CENTER): Payer: Medicare HMO | Admitting: Internal Medicine

## 2023-06-22 ENCOUNTER — Encounter: Payer: Self-pay | Admitting: Internal Medicine

## 2023-06-22 VITALS — BP 108/61 | HR 59 | Temp 97.1°F | Resp 13 | Ht 60.0 in | Wt 130.8 lb

## 2023-06-22 DIAGNOSIS — Z1211 Encounter for screening for malignant neoplasm of colon: Secondary | ICD-10-CM | POA: Diagnosis not present

## 2023-06-22 MED ORDER — SODIUM CHLORIDE 0.9 % IV SOLN: 500.0000 mL | Freq: Once | INTRAVENOUS | Status: DC

## 2023-06-22 NOTE — Op Note (Signed)
[facility] Patient Name: Colleen Elliott Procedure Date: 06/22/2023 8:37 AM MRN: [id number] Endoscopist: Iva Boop , MD, [id number] Age: 67 Referring MD:  Date of Birth: 11-23-1955 Gender: Female Account #: [id number] Procedure:                Colonoscopy Indications:              Screening for colorectal malignant neoplasm, Last                            colonoscopy: 2011 Medicines:                Monitored Anesthesia Care Procedure:                Pre-Anesthesia Assessment:                           - Prior to the procedure, a History and Physical                            was performed, and patient medications and                            allergies were reviewed. The patient's tolerance of                            previous anesthesia was also reviewed. The risks                            and benefits of the procedure and the sedation                            options and risks were discussed with the patient.                            All questions were answered, and informed consent                            was obtained. Prior Anticoagulants: The patient has                            taken no anticoagulant or antiplatelet agents. ASA                            Grade Assessment: II - A patient with mild systemic                            disease. After reviewing the risks and benefits,                            the patient was deemed in satisfactory condition to                            undergo the procedure.  After obtaining informed consent, the colonoscope                            was passed under direct vision. Throughout the                            procedure, the patient's blood pressure, pulse, and                            oxygen saturations were monitored continuously. The                            CF [id number] #[id number] was introduced through the anus                            and advanced to the the cecum, identified  by                            appendiceal orifice and ileocecal valve. The                            colonoscopy was performed without difficulty. The                            patient tolerated the procedure well. The quality                            of the bowel preparation was good. The bowel                            preparation used was SUPREP via split dose                            instruction. The ileocecal valve, appendiceal                            orifice, and rectum were photographed. Scope In: 8:44:36 AM Scope Out: 8:58:44 AM Scope Withdrawal Time: 0 hours 9 minutes 37 seconds  Total Procedure Duration: 0 hours 14 minutes 8 seconds  Findings:                 The perianal and digital rectal examinations were                            normal.                           Multiple diverticula were found in the sigmoid                            colon, descending colon, transverse colon and                            ascending colon.  The exam was otherwise without abnormality on                            direct and retroflexion views. Complications:            No immediate complications. Estimated Blood Loss:     Estimated blood loss: none. Impression:               - Diverticulosis in the sigmoid colon, in the                            descending colon, in the transverse colon and in                            the ascending colon.                           - The examination was otherwise normal on direct                            and retroflexion views.                           - No specimens collected. Recommendation:           - Patient has a contact number available for                            emergencies. The signs and symptoms of potential                            delayed complications were discussed with the                            patient. Return to normal activities tomorrow.                            Written discharge  instructions were provided to the                            patient.                           - Resume previous diet.                           - Continue present medications.                           - No repeat colonoscopy due to current age (65                            years or older) and the absence of colonic polyps. Iva Boop, MD 06/22/2023 9:04:01 AM This report has been signed electronically.

## 2023-06-22 NOTE — Progress Notes (Signed)
Report to PACU, RN, vss, BBS= Clear.  

## 2023-06-22 NOTE — Patient Instructions (Addendum)
No polyps or cancer were seen.  You do have a condition called diverticulosis - common and not usually a problem. Please read the handout provided.  Given the results of this and past colonoscopy and your age, I am not recommending another routine repeat colonoscopy.  I appreciate the opportunity to care for you. Iva Boop, MD, FACG   YOU HAD AN ENDOSCOPIC PROCEDURE TODAY AT THE [facility]:   Refer to the procedure report that was given to you for any specific questions about what was found during the examination.  If the procedure report does not answer your questions, please call your gastroenterologist to clarify.  If you requested that your care partner not be given the details of your procedure findings, then the procedure report has been included in a sealed envelope for you to review at your convenience later.  **Handout given on diverticulosis**  YOU SHOULD EXPECT: Some feelings of bloating in the abdomen. Passage of more gas than usual.  Walking can help get rid of the air that was put into your GI tract during the procedure and reduce the bloating. If you had a lower endoscopy (such as a colonoscopy or flexible sigmoidoscopy) you may notice spotting of blood in your stool or on the toilet paper. If you underwent a bowel prep for your procedure, you may not have a normal bowel movement for a few days.  Please Note:  You might notice some irritation and congestion in your nose or some drainage.  This is from the oxygen used during your procedure.  There is no need for concern and it should clear up in a day or so.  SYMPTOMS TO REPORT IMMEDIATELY:  Following lower endoscopy (colonoscopy or flexible sigmoidoscopy):  Excessive amounts of blood in the stool  Significant tenderness or worsening of abdominal pains  Swelling of the abdomen that is new, acute  Fever of 100F or higher  For urgent or emergent issues, a gastroenterologist can be reached at any hour by  calling [phone]. Do not use MyChart messaging for urgent concerns.    DIET:  We do recommend a small meal at first, but then you may proceed to your regular diet.  Drink plenty of fluids but you should avoid alcoholic beverages for 24 hours.  ACTIVITY:  You should plan to take it easy for the rest of today and you should NOT DRIVE or use heavy machinery until tomorrow (because of the sedation medicines used during the test).    FOLLOW UP: Our staff will call the number listed on your records the next business day following your procedure.  We will call around 7:15- 8:00 am to check on you and address any questions or concerns that you may have regarding the information given to you following your procedure. If we do not reach you, we will leave a message.     If any biopsies were taken you will be contacted by phone or by letter within the next 1-3 weeks.  Please call us at [phone] if you have not heard about the biopsies in 3 weeks.    SIGNATURES/CONFIDENTIALITY: You and/or your care partner have signed paperwork which will be entered into your electronic medical record.  These signatures attest to the fact that that the information above on your After Visit Summary has been reviewed and is understood.  Full responsibility of the confidentiality of this discharge information lies with you and/or your care-partner.

## 2023-06-22 NOTE — Progress Notes (Signed)
Pt's states no medical or surgical changes since previsit or office visit. VS assessed by K.P ?

## 2023-06-22 NOTE — Progress Notes (Signed)
[facility] History and Physical   Primary Care Physician:  Dettinger, Elige Radon, MD   Reason for Procedure:    Encounter Diagnosis  Name Primary?   Special screening for malignant neoplasms, colon Yes     Plan:    colonoscopy     HPI: Colleen Elliott is a 67 y.o. female for screening exam Last exam 2011 - diverticulosis and no polyps  Past Medical History:  Diagnosis Date   Allergy    seasonal   Amenorrhea    Anemia    Glaucoma    trace   Hematuria    negative work up   Kidney stone    No pertinent past medical history    Osteoporosis    Pure hypercholesterolemia 01/21/2022    Past Surgical History:  Procedure Laterality Date   TUBAL LIGATION      Prior to Admission medications   Medication Sig Start Date End Date Taking? Authorizing Provider  fluticasone (FLONASE) 50 MCG/ACT nasal spray Place 2 sprays into both nostrils daily. Patient not taking: Reported on 06/22/2023 01/21/22   Sonny Masters, FNP  loratadine (CLARITIN) 10 MG tablet Take 1 tablet (10 mg total) by mouth daily. Patient not taking: Reported on 06/22/2023 01/21/22   Sonny Masters, FNP  Multiple Vitamin (MULTIVITAMIN PO) Take by mouth. Patient not taking: Reported on 06/22/2023    [provider]    Current Outpatient Medications  Medication Sig Dispense Refill   fluticasone (FLONASE) 50 MCG/ACT nasal spray Place 2 sprays into both nostrils daily. (Patient not taking: Reported on 06/22/2023) 16 g 6   loratadine (CLARITIN) 10 MG tablet Take 1 tablet (10 mg total) by mouth daily. (Patient not taking: Reported on 06/22/2023) 30 tablet 11   Multiple Vitamin (MULTIVITAMIN PO) Take by mouth. (Patient not taking: Reported on 06/22/2023)     Current Facility-Administered Medications  Medication Dose Route Frequency Provider Last Rate Last Admin   0.9 %  sodium chloride infusion  500 mL Intravenous Once Iva Boop, MD        Allergies as of 06/22/2023 - Review Complete  06/22/2023  Allergen Reaction Noted   Ciprofloxacin  05/21/2013    Family History  Problem Relation Age of Onset   Arthritis Mother    Heart disease Father    Hyperlipidemia Brother    Uterine cancer Maternal Aunt    Ovarian cancer Maternal Aunt    Diabetes Paternal Aunt    Heart disease Paternal Aunt    Heart disease Paternal Uncle    Colon cancer Neg Hx    Colon polyps Neg Hx    Esophageal cancer Neg Hx    Rectal cancer Neg Hx    Stomach cancer Neg Hx     Social History   Socioeconomic History   Marital status: Married    Spouse name: Not on file   Number of children: Not on file   Years of education: Not on file   Highest education level: Not on file  Occupational History   Not on file  Tobacco Use   Smoking status: Never   Smokeless tobacco: Never  Vaping Use   Vaping status: Never Used  Substance and Sexual Activity   Alcohol use: Yes    Alcohol/week: 1.0 standard drink of alcohol    Types: 1 Standard drinks or equivalent per week   Drug use: No   Sexual activity: Yes    Partners: Male    Birth control/protection: Surgical    Comment:  tubal ligation  Other Topics Concern   Not on file  Social History Narrative   Not on file   Social Determinants of Health   Financial Resource Strain: Low Risk  (04/08/2023)   Overall Financial Resource Strain (CARDIA)    Difficulty of Paying Living Expenses: Not hard at all  Food Insecurity: No Food Insecurity (04/08/2023)   Hunger Vital Sign    Worried About Running Out of Food in the Last Year: Never true    Ran Out of Food in the Last Year: Never true  Transportation Needs: No Transportation Needs (04/08/2023)   PRAPARE - Administrator, Civil Service (Medical): No    Lack of Transportation (Non-Medical): No  Physical Activity: Insufficiently Active (04/08/2023)   Exercise Vital Sign    Days of Exercise per Week: 3 days    Minutes of Exercise per Session: 30 min  Stress: No Stress Concern Present  (04/08/2023)   [employer] of Occupational Health - Occupational Stress Questionnaire    Feeling of Stress : Not at all  Social Connections: Moderately Integrated (04/08/2023)   Social Connection and Isolation Panel [NHANES]    Frequency of Communication with Friends and Family: More than three times a week    Frequency of Social Gatherings with Friends and Family: More than three times a week    Attends Religious Services: More than 4 times per year    Active Member of [employer] or Organizations: No    Attends Banker Meetings: Never    Marital Status: Married  Catering manager Violence: Not At Risk (04/08/2023)   Humiliation, Afraid, Rape, and Kick questionnaire    Fear of Current or Ex-Partner: No    Emotionally Abused: No    Physically Abused: No    Sexually Abused: No    Review of Systems:  All other review of systems negative except as mentioned in the HPI.  Physical Exam: Vital signs BP 117/66   Pulse 91   Temp (!) 97.1 F (36.2 C) (Skin)   Ht 5' (1.524 m)   Wt 130 lb 12.8 oz (59.3 kg)   LMP 01/25/2005   SpO2 97%   BMI 25.55 kg/m   General:   Alert,  Well-developed, well-nourished, pleasant and cooperative in NAD Lungs:  Clear throughout to auscultation.   Heart:  Regular rate and rhythm; no murmurs, clicks, rubs,  or gallops. Abdomen:  Soft, nontender and nondistended. Normal bowel sounds.   Neuro/Psych:  Alert and cooperative. Normal mood and affect. A and O x 3   @Connie Hilgert  Sena Slate, MD, [facility] [phone] (pager) 06/22/2023 8:36 AM@

## 2023-06-23 ENCOUNTER — Telehealth: Payer: Self-pay | Admitting: *Deleted

## 2023-06-23 NOTE — Telephone Encounter (Signed)
  Follow up Call-     06/22/2023    7:52 AM  Call back number  Post procedure Call Back phone  # [phone]  Permission to leave phone message Yes     Patient questions:  Do you have a fever, pain , or abdominal swelling? No. Pain Score  0 *  Have you tolerated food without any problems? Yes.    Have you been able to return to your normal activities? Yes.    Do you have any questions about your discharge instructions: Diet   No. Medications  No. Follow up visit  No.  Do you have questions or concerns about your Care? No.  Actions: * If pain score is 4 or above: No action needed, pain <4.

## 2023-09-12 DIAGNOSIS — H40053 Ocular hypertension, bilateral: Secondary | ICD-10-CM | POA: Diagnosis not present

## 2023-09-12 DIAGNOSIS — H04123 Dry eye syndrome of bilateral lacrimal glands: Secondary | ICD-10-CM | POA: Diagnosis not present

## 2023-09-12 DIAGNOSIS — H52223 Regular astigmatism, bilateral: Secondary | ICD-10-CM | POA: Diagnosis not present

## 2023-09-12 DIAGNOSIS — H2513 Age-related nuclear cataract, bilateral: Secondary | ICD-10-CM | POA: Diagnosis not present

## 2023-09-12 DIAGNOSIS — H21233 Degeneration of iris (pigmentary), bilateral: Secondary | ICD-10-CM | POA: Diagnosis not present

## 2023-09-12 DIAGNOSIS — H524 Presbyopia: Secondary | ICD-10-CM | POA: Diagnosis not present

## 2023-09-12 DIAGNOSIS — H5213 Myopia, bilateral: Secondary | ICD-10-CM | POA: Diagnosis not present

## 2023-09-15 ENCOUNTER — Other Ambulatory Visit: Payer: Self-pay | Admitting: Family Medicine

## 2023-09-15 DIAGNOSIS — Z1231 Encounter for screening mammogram for malignant neoplasm of breast: Secondary | ICD-10-CM

## 2023-10-03 DIAGNOSIS — H40053 Ocular hypertension, bilateral: Secondary | ICD-10-CM | POA: Diagnosis not present

## 2023-10-03 DIAGNOSIS — H04123 Dry eye syndrome of bilateral lacrimal glands: Secondary | ICD-10-CM | POA: Diagnosis not present

## 2023-10-03 DIAGNOSIS — H2513 Age-related nuclear cataract, bilateral: Secondary | ICD-10-CM | POA: Diagnosis not present

## 2023-10-03 DIAGNOSIS — H21233 Degeneration of iris (pigmentary), bilateral: Secondary | ICD-10-CM | POA: Diagnosis not present

## 2023-10-19 ENCOUNTER — Ambulatory Visit
Admission: RE | Admit: 2023-10-19 | Discharge: 2023-10-19 | Disposition: A | Discharge status: Home / Self Care | Payer: Medicare HMO | Source: Ambulatory Visit | Attending: Family Medicine

## 2023-10-19 DIAGNOSIS — Z1231 Encounter for screening mammogram for malignant neoplasm of breast: Secondary | ICD-10-CM

## 2023-10-24 DIAGNOSIS — H2512 Age-related nuclear cataract, left eye: Secondary | ICD-10-CM | POA: Diagnosis not present

## 2023-11-09 ENCOUNTER — Encounter: Payer: Medicare HMO | Admitting: Family Medicine

## 2023-11-15 DIAGNOSIS — H2512 Age-related nuclear cataract, left eye: Secondary | ICD-10-CM | POA: Diagnosis not present

## 2023-11-23 ENCOUNTER — Ambulatory Visit (INDEPENDENT_AMBULATORY_CARE_PROVIDER_SITE_OTHER): Payer: Medicare HMO

## 2023-11-23 ENCOUNTER — Ambulatory Visit (INDEPENDENT_AMBULATORY_CARE_PROVIDER_SITE_OTHER): Payer: Medicare HMO | Admitting: Family Medicine

## 2023-11-23 VITALS — BP 128/80 | HR 78 | Temp 97.1°F | Ht 61.0 in | Wt 127.8 lb

## 2023-11-23 DIAGNOSIS — H2511 Age-related nuclear cataract, right eye: Secondary | ICD-10-CM | POA: Diagnosis not present

## 2023-11-23 DIAGNOSIS — Z0000 Encounter for general adult medical examination without abnormal findings: Secondary | ICD-10-CM | POA: Diagnosis not present

## 2023-11-23 DIAGNOSIS — M810 Age-related osteoporosis without current pathological fracture: Secondary | ICD-10-CM | POA: Diagnosis not present

## 2023-11-23 DIAGNOSIS — Z130 Encounter for screening for diseases of the blood and blood-forming organs and certain disorders involving the immune mechanism: Secondary | ICD-10-CM | POA: Diagnosis not present

## 2023-11-23 DIAGNOSIS — Z0001 Encounter for general adult medical examination with abnormal findings: Secondary | ICD-10-CM

## 2023-11-23 DIAGNOSIS — Z136 Encounter for screening for cardiovascular disorders: Secondary | ICD-10-CM | POA: Diagnosis not present

## 2023-11-23 DIAGNOSIS — E7800 Pure hypercholesterolemia, unspecified: Secondary | ICD-10-CM

## 2023-11-23 DIAGNOSIS — Z1329 Encounter for screening for other suspected endocrine disorder: Secondary | ICD-10-CM

## 2023-11-23 LAB — BAYER DCA HB A1C WAIVED: HB A1C (BAYER DCA - WAIVED): 5.3 % (ref 4.8–5.6)

## 2023-11-23 NOTE — Progress Notes (Signed)
 Complete physical exam  Patient: Colleen Elliott   DOB: 1956/06/30   68 y.o. Female  MRN: [id number]  Subjective:    Chief Complaint  Patient presents with   Annual Exam    Colleen Elliott is a 68 y.o. female who presents today for a complete physical exam. She reports consuming a general diet. The patient does not participate in regular exercise at present. She generally feels well. She reports sleeping well. She does not have additional problems to discuss today.    History of Present Illness   Colleen Elliott is a 68 year old female who presents for an annual physical exam.  She has not experienced any changes in bowel or bladder habits, and her sleep remains unaffected. She continues to use Claritin as needed.  She recently underwent cataract surgery in her left eye, resulting in 20/20 vision, and is scheduled for surgery on the other eye in about three weeks. She is hopeful about reducing her dependency on glasses, possibly only needing readers.  She has a history of osteoporosis and takes a multivitamin and vitamin D daily. She cannot tolerate osteoporosis medications due to side effects such as acid reflux and hypercalcemia, which previously led to breast calcifications and kidney stones. She has not experienced any fractures and remains active, caring for her great-grandchild and mother-in-law.  She is concerned about her family history of diabetes, as her brother was recently diagnosed with severe diabetes, with a blood sugar level of 533 mg/dL. She reports increased thirst but is unsure if it is due to her busy lifestyle. No increased hunger or urination. She has lost three pounds unintentionally.  Her family history is significant for heart disease, and she experiences leg swelling attributed to varicose veins. She has not pursued treatment for the veins but uses sports compression socks. No chest pain or other cardiac symptoms.  She has not received the flu or  shingles vaccines this season, having had the flu about a month and a half ago.       Most recent fall risk assessment:    11/23/2023    2:39 PM  Fall Risk   Falls in the past year? 0  Number falls in past yr: 0  Injury with Fall? 0  Risk for fall due to : No Fall Risks  Follow up Falls evaluation completed     Most recent depression screenings:    11/23/2023    2:40 PM 04/08/2023   11:55 AM  PHQ 2/9 Scores  PHQ - 2 Score 0 0  PHQ- 9 Score 0     Vision:Within last year and Dental: No current dental problems and Receives regular dental care  Patient Active Problem List   Diagnosis Date Noted   Pure hypercholesterolemia 01/21/2022   Osteoporosis 07/15/2021   Past Medical History:  Diagnosis Date   Allergy    seasonal   Amenorrhea    Anemia    Glaucoma    trace   Hematuria    negative work up   Kidney stone    No pertinent past medical history    Osteoporosis    Pure hypercholesterolemia 01/21/2022   Past Surgical History:  Procedure Laterality Date   COLONOSCOPY     TUBAL LIGATION     Social History   Tobacco Use   Smoking status: Never   Smokeless tobacco: Never  Vaping Use   Vaping status: Never Used  Substance Use Topics   Alcohol use: Yes  Alcohol/week: 1.0 standard drink of alcohol    Types: 1 Standard drinks or equivalent per week   Drug use: No   Social History   Socioeconomic History   Marital status: Married    Spouse name: Not on file   Number of children: Not on file   Years of education: Not on file   Highest education level: Some college, no degree  Occupational History   Not on file  Tobacco Use   Smoking status: Never   Smokeless tobacco: Never  Vaping Use   Vaping status: Never Used  Substance and Sexual Activity   Alcohol use: Yes    Alcohol/week: 1.0 standard drink of alcohol    Types: 1 Standard drinks or equivalent per week   Drug use: No   Sexual activity: Yes    Partners: Male    Birth control/protection:  Surgical    Comment: tubal ligation  Other Topics Concern   Not on file  Social History Narrative   Not on file   Social Drivers of Health   Financial Resource Strain: Low Risk  (11/23/2023)   Overall Financial Resource Strain (CARDIA)    Difficulty of Paying Living Expenses: Not very hard  Food Insecurity: No Food Insecurity (11/23/2023)   Hunger Vital Sign    Worried About Running Out of Food in the Last Year: Never true    Ran Out of Food in the Last Year: Never true  Transportation Needs: No Transportation Needs (11/23/2023)   PRAPARE - Administrator, Civil Service (Medical): No    Lack of Transportation (Non-Medical): No  Physical Activity: Insufficiently Active (11/23/2023)   Exercise Vital Sign    Days of Exercise per Week: 2 days    Minutes of Exercise per Session: 20 min  Stress: Stress Concern Present (11/23/2023)   [employer] of Occupational Health - Occupational Stress Questionnaire    Feeling of Stress : To some extent  Social Connections: Moderately Integrated (11/23/2023)   Social Connection and Isolation Panel [NHANES]    Frequency of Communication with Friends and Family: Once a week    Frequency of Social Gatherings with Friends and Family: Once a week    Attends Religious Services: More than 4 times per year    Active Member of [employer] or Organizations: Yes    Attends Banker Meetings: More than 4 times per year    Marital Status: Married  Catering manager Violence: Not At Risk (04/08/2023)   Humiliation, Afraid, Rape, and Kick questionnaire    Fear of Current or Ex-Partner: No    Emotionally Abused: No    Physically Abused: No    Sexually Abused: No   Family Status  Relation Name Status   Mother  Deceased   Father  Deceased   Brother  (Not Specified)   Mat Aunt  (Not Specified)   Emelda Brothers  (Not Specified)   Nutritional therapist  (Not Specified)   MGM  Deceased   MGF  Deceased   PGM  Deceased   PGF  Deceased   Neg Hx  (Not  Specified)  No partnership data on file   Family History  Problem Relation Age of Onset   Arthritis Mother    Heart disease Father    Hyperlipidemia Brother    Uterine cancer Maternal Aunt    Ovarian cancer Maternal Aunt    Diabetes Paternal Aunt    Heart disease Paternal Aunt    Heart disease Paternal Uncle  Colon cancer Neg Hx    Colon polyps Neg Hx    Esophageal cancer Neg Hx    Rectal cancer Neg Hx    Stomach cancer Neg Hx    Allergies  Allergen Reactions   Ciprofloxacin     Bad headache      Patient Care Team: Sonny Masters, FNP as PCP - General (Family Medicine) Romualdo Bolk, MD (Inactive) as Consulting Physician (Obstetrics and Gynecology)   Outpatient Medications Prior to Visit  Medication Sig   loratadine (CLARITIN) 10 MG tablet Take 1 tablet (10 mg total) by mouth daily.   Multiple Vitamin (MULTIVITAMIN PO) Take by mouth.   [DISCONTINUED] fluticasone (FLONASE) 50 MCG/ACT nasal spray Place 2 sprays into both nostrils daily. (Patient not taking: Reported on 06/22/2023)   No facility-administered medications prior to visit.    ROS per HPI     Objective:     BP 128/80   Pulse 78   Temp (!) 97.1 F (36.2 C)   Ht 5\' 1"  (1.549 m)   Wt 127 lb 12.8 oz (58 kg)   LMP 01/25/2005   SpO2 98%   BMI 24.15 kg/m  BP Readings from Last 3 Encounters:  11/23/23 128/80  06/22/23 108/61  01/21/22 112/70   Wt Readings from Last 3 Encounters:  11/23/23 127 lb 12.8 oz (58 kg)  06/22/23 130 lb 12.8 oz (59.3 kg)  06/06/23 130 lb 12.8 oz (59.3 kg)   SpO2 Readings from Last 3 Encounters:  11/23/23 98%  06/22/23 98%  01/21/22 96%      Physical Exam Vitals and nursing note reviewed.  Constitutional:      General: She is not in acute distress.    Appearance: Normal appearance. She is well-developed, well-groomed and normal weight. She is not ill-appearing, toxic-appearing or diaphoretic.  HENT:     Head: Normocephalic and atraumatic.     Jaw: There is  normal jaw occlusion.     Right Ear: Hearing, tympanic membrane, ear canal and external ear normal.     Left Ear: Hearing, tympanic membrane, ear canal and external ear normal.     Nose: Nose normal.     Mouth/Throat:     Lips: Pink.     Mouth: Mucous membranes are moist.     Pharynx: Oropharynx is clear. Uvula midline.  Eyes:     General: Lids are normal.     Extraocular Movements: Extraocular movements intact.     Conjunctiva/sclera: Conjunctivae normal.     Pupils: Pupils are equal, round, and reactive to light.  Neck:     Thyroid: No thyroid mass, thyromegaly or thyroid tenderness.     Vascular: No carotid bruit or JVD.     Trachea: Trachea and phonation normal.  Cardiovascular:     Rate and Rhythm: Normal rate and regular rhythm.     Chest Wall: PMI is not displaced.     Pulses: Normal pulses.     Heart sounds: Normal heart sounds. No murmur heard.    No friction rub. No gallop.  Pulmonary:     Effort: Pulmonary effort is normal. No respiratory distress.     Breath sounds: Normal breath sounds. No wheezing.  Abdominal:     General: Bowel sounds are normal. There is no distension or abdominal bruit.     Palpations: Abdomen is soft. There is no hepatomegaly or splenomegaly.     Tenderness: There is no abdominal tenderness. There is no right CVA tenderness or left CVA tenderness.  Hernia: No hernia is present.  Musculoskeletal:        General: Normal range of motion.     Cervical back: Normal range of motion and neck supple.     Right lower leg: No edema.     Left lower leg: No edema.  Lymphadenopathy:     Cervical: No cervical adenopathy.  Skin:    General: Skin is warm and dry.     Capillary Refill: Capillary refill takes less than 2 seconds.     Coloration: Skin is not cyanotic, jaundiced or pale.     Findings: No rash.  Neurological:     General: No focal deficit present.     Mental Status: She is alert and oriented to person, place, and time.     Sensory:  Sensation is intact.     Motor: Motor function is intact.     Coordination: Coordination is intact.     Gait: Gait is intact.     Deep Tendon Reflexes: Reflexes are normal and symmetric.  Psychiatric:        Attention and Perception: Attention and perception normal.        Mood and Affect: Mood and affect normal.        Speech: Speech normal.        Behavior: Behavior normal. Behavior is cooperative.        Thought Content: Thought content normal.        Cognition and Memory: Cognition and memory normal.        Judgment: Judgment normal.       Last CBC Lab Results  Component Value Date   WBC 6.9 01/21/2022   HGB 12.0 01/21/2022   HCT 35.5 01/21/2022   MCV 92 01/21/2022   MCH 31.1 01/21/2022   RDW 12.4 01/21/2022   PLT 308 01/21/2022   Last metabolic panel Lab Results  Component Value Date   GLUCOSE 92 01/21/2022   NA 141 01/21/2022   K 4.5 01/21/2022   CL 102 01/21/2022   CO2 26 01/21/2022   BUN 13 01/21/2022   CREATININE 0.67 01/21/2022   EGFR 97 01/21/2022   CALCIUM 8.9 01/21/2022   PROT 6.4 01/21/2022   ALBUMIN 4.0 01/21/2022   LABGLOB 2.4 01/21/2022   AGRATIO 1.7 01/21/2022   BILITOT <0.2 01/21/2022   ALKPHOS 88 01/21/2022   AST 19 01/21/2022   ALT 24 01/21/2022   Last lipids Lab Results  Component Value Date   CHOL 212 (H) 01/21/2022   HDL 59 01/21/2022   LDLCALC 129 (H) 01/21/2022   TRIG 137 01/21/2022   CHOLHDL 3.6 01/21/2022   Last thyroid functions Lab Results  Component Value Date   TSH 2.890 01/21/2022   T4TOTAL 7.7 01/21/2022   Last vitamin D Lab Results  Component Value Date   VD25OH 38.5 08/25/2020   Last vitamin B12 and Folate Lab Results  Component Value Date   VITAMINB12 309 03/02/2012        Assessment & Plan:    Routine Health Maintenance and Physical Exam  Immunization History  Administered Date(s) Administered   Tdap 08/22/2019    Health Maintenance  Topic Date Due   DEXA SCAN  07/16/2023   COVID-19 Vaccine  (1 - 2024-25 season) 12/09/2023 (Originally 05/29/2023)   INFLUENZA VACCINE  12/26/2023 (Originally 04/28/2023)   Zoster Vaccines- Shingrix (1 of 2) 02/20/2024 (Originally 03/29/2006)   Pneumonia Vaccine 8+ Years old (1 of 1 - PCV) 11/22/2024 (Originally 03/29/2021)   Medicare Annual Wellness (AWV)  04/07/2024   MAMMOGRAM  10/18/2025   DTaP/Tdap/Td (2 - Td or Tdap) 08/21/2029   Hepatitis C Screening  Completed   HPV VACCINES  Aged Out   Colonoscopy  Discontinued    Discussed health benefits of physical activity, and encouraged her to engage in regular exercise appropriate for her age and condition.  Problem List Items Addressed This Visit       Musculoskeletal and Integument   Osteoporosis   Relevant Orders   CMP14+EGFR   VITAMIN D 25 Hydroxy (Vit-D Deficiency, Fractures)   DG [facility] DEXA     Other   Pure hypercholesterolemia   Relevant Orders   Lipid panel   CMP14+EGFR   Other Visit Diagnoses       Annual physical exam    -  Primary   Relevant Orders   Lipid panel   CBC with Differential/Platelet   CMP14+EGFR   Thyroid Panel With TSH   VITAMIN D 25 Hydroxy (Vit-D Deficiency, Fractures)   DG [facility] DEXA     Encounter for screening for cardiovascular disorders       Relevant Orders   Lipid panel   CBC with Differential/Platelet   CMP14+EGFR     Screening for deficiency anemia       Relevant Orders   CBC with Differential/Platelet     Screening for endocrine disorder       Relevant Orders   CMP14+EGFR   Thyroid Panel With TSH   Bayer DCA Hb A1c Waived     Assessment and Plan    Cataract Surgery Follow-up Post-operative follow-up for left eye cataract surgery. Reports 20/20 vision in the left eye. Scheduled for right eye cataract surgery in three weeks. No complications reported. Anticipates needing only reading glasses post-surgery. Benefits include improved night driving and overall vision. Risks discussed include potential for infection, inflammation, and need for  further corrective procedures. - Proceed with right eye cataract surgery as scheduled  Family History of Diabetes Mellitus Family history of type 2 diabetes mellitus. Brother recently diagnosed with severe diabetes (blood sugar 533 mg/dL). Expresses concern and requests blood work to rule out diabetes. Reports increased thirst but attributes it to being active. Discussed the importance of monitoring for symptoms such as increased hunger, thirst, and urination. - Order blood work including A1c, cholesterol, vitamin D, blood counts, liver function, and kidney function  Osteoporosis Chronic condition. No fractures reported. Takes a multivitamin and vitamin D but cannot tolerate osteoporosis medications due to side effects including acid reflux and hypercalcemia leading to breast calcifications and kidney stones. Remains active by caring for her great-grandchild and mother-in-law. Discussed the importance of continued physical activity and vitamin D supplementation for bone health. - Order DEXA scan - Continue multivitamin and vitamin D supplementation - Encourage continued physical activity  Varicose Veins Chronic condition. Reports leg swelling, particularly in one leg. No prior treatment for varicose veins. Advised to use compression socks. Discussed the benefits of sports compression socks for managing symptoms without the tightness of regular compression socks. - Recommend sports compression socks for leg swelling  General Health Maintenance Up to date on mammogram and colonoscopy. Declined flu and shingles vaccines. No changes in bowel or bladder habits. No issues with sleep. No rapid weight changes except for an unintentional 3-pound weight loss. Discussed the patient's preference for not receiving the flu and shingles vaccines and the current low recommendation for flu vaccine due to the season. - No flu shot recommended this season - Monitor for any new symptoms  or changes in  health  Follow-up - Perform DEXA scan - Obtain blood work - Schedule follow-up visit to review results.       Return in about 1 year (around 11/22/2024), or if symptoms worsen or fail to improve, for Annual Physical.     Kari Baars, FNP

## 2023-11-24 DIAGNOSIS — Z780 Asymptomatic menopausal state: Secondary | ICD-10-CM | POA: Diagnosis not present

## 2023-11-24 DIAGNOSIS — M810 Age-related osteoporosis without current pathological fracture: Secondary | ICD-10-CM | POA: Diagnosis not present

## 2023-11-24 LAB — CMP14+EGFR
ALT: 12 [IU]/L (ref 0–32)
AST: 15 [IU]/L (ref 0–40)
Albumin: 4.2 g/dL (ref 3.9–4.9)
Alkaline Phosphatase: 77 [IU]/L (ref 44–121)
BUN/Creatinine Ratio: 20 (ref 12–28)
BUN: 14 mg/dL (ref 8–27)
Bilirubin Total: 0.3 mg/dL (ref 0.0–1.2)
CO2: 24 mmol/L (ref 20–29)
Calcium: 9.2 mg/dL (ref 8.7–10.3)
Chloride: 102 mmol/L (ref 96–106)
Creatinine, Ser: 0.7 mg/dL (ref 0.57–1.00)
Globulin, Total: 2.2 g/dL (ref 1.5–4.5)
Glucose: 83 mg/dL (ref 70–99)
Potassium: 4.2 mmol/L (ref 3.5–5.2)
Sodium: 139 mmol/L (ref 134–144)
Total Protein: 6.4 g/dL (ref 6.0–8.5)
eGFR: 95 mL/min/{1.73_m2} (ref >59)

## 2023-11-24 LAB — CBC WITH DIFFERENTIAL/PLATELET
Basophils Absolute: 0 10*3/uL (ref 0.0–0.2)
Basos: 0 %
EOS (ABSOLUTE): 0.1 10*3/uL (ref 0.0–0.4)
Eos: 2 %
Hematocrit: 37.6 % (ref 34.0–46.6)
Hemoglobin: 12.5 g/dL (ref 11.1–15.9)
Immature Grans (Abs): 0 10*3/uL (ref 0.0–0.1)
Immature Granulocytes: 0 %
Lymphocytes Absolute: 2.3 10*3/uL (ref 0.7–3.1)
Lymphs: 37 %
MCH: 30.9 pg (ref 26.6–33.0)
MCHC: 33.2 g/dL (ref 31.5–35.7)
MCV: 93 fL (ref 79–97)
Monocytes Absolute: 0.5 10*3/uL (ref 0.1–0.9)
Monocytes: 7 %
Neutrophils Absolute: 3.3 10*3/uL (ref 1.4–7.0)
Neutrophils: 54 %
Platelets: 291 10*3/uL (ref 150–450)
RBC: 4.04 x10E6/uL (ref 3.77–5.28)
RDW: 11.9 % (ref 11.7–15.4)
WBC: 6.3 10*3/uL (ref 3.4–10.8)

## 2023-11-24 LAB — THYROID PANEL WITH TSH
Free Thyroxine Index: 1.8 (ref 1.2–4.9)
T3 Uptake Ratio: 25 % (ref 24–39)
T4, Total: 7.2 ug/dL (ref 4.5–12.0)
TSH: 2.1 u[IU]/mL (ref 0.450–4.500)

## 2023-11-24 LAB — LIPID PANEL
Chol/HDL Ratio: 3.1 {ratio} (ref 0.0–4.4)
Cholesterol, Total: 219 mg/dL — ABNORMAL HIGH (ref 100–199)
HDL: 70 mg/dL (ref >39)
LDL Chol Calc (NIH): 139 mg/dL — ABNORMAL HIGH (ref 0–99)
Triglycerides: 59 mg/dL (ref 0–149)
VLDL Cholesterol Cal: 10 mg/dL (ref 5–40)

## 2023-11-24 LAB — VITAMIN D 25 HYDROXY (VIT D DEFICIENCY, FRACTURES): Vit D, 25-Hydroxy: 34.9 ng/mL (ref 30.0–100.0)

## 2023-11-25 ENCOUNTER — Encounter: Payer: Self-pay | Admitting: Family Medicine

## 2023-12-13 DIAGNOSIS — H2511 Age-related nuclear cataract, right eye: Secondary | ICD-10-CM | POA: Diagnosis not present

## 2024-04-10 ENCOUNTER — Ambulatory Visit: Payer: Medicare HMO

## 2024-04-10 VITALS — BP 128/80 | HR 78 | Ht 61.0 in | Wt 127.0 lb

## 2024-04-10 DIAGNOSIS — Z0000 Encounter for general adult medical examination without abnormal findings: Secondary | ICD-10-CM

## 2024-04-10 NOTE — Patient Instructions (Signed)
 Colleen Elliott , Thank you for taking time out of your busy schedule to complete your Annual Wellness Visit with me. I enjoyed our conversation and look forward to speaking with you again next year. I, as well as your care team,  appreciate your ongoing commitment to your health goals. Please review the following plan we discussed and let me know if I can assist you in the future. Your Game plan/ To Do List    Follow up Visits: Next Medicare AWV with our clinical staff: 04/11/25 at 8:40a.m.    Next Office Visit with your provider: 11/23/24 at 2:05p.m.  Clinician Recommendations:  Aim for 30 minutes of exercise or brisk walking, 6-8 glasses of water, and 5 servings of fruits and vegetables each day.       This is a list of the screening recommended for you and due dates:  Health Maintenance  Topic Date Due   Zoster (Shingles) Vaccine (1 of 2) Never done   Medicare Annual Wellness Visit  04/07/2024   COVID-19 Vaccine (1 - 2024-25 season) 04/26/2024*   Pneumococcal Vaccine for age over 48 (1 of 1 - PCV) 11/22/2024*   Flu Shot  04/27/2024   Mammogram  10/18/2025   DEXA scan (bone density measurement)  11/23/2025   DTaP/Tdap/Td vaccine (2 - Td or Tdap) 08/21/2029   Hepatitis C Screening  Completed   Hepatitis B Vaccine  Aged Out   HPV Vaccine  Aged Out   Meningitis B Vaccine  Aged Out   Colon Cancer Screening  Discontinued  *Topic was postponed. The date shown is not the original due date.    Advanced directives: (Declined) Advance directive discussed with you today. Even though you declined this today, please call our office should you change your mind, and we can give you the proper paperwork for you to fill out. Advance Care Planning is important because it:  [x]  Makes sure you receive the medical care that is consistent with your values, goals, and preferences  [x]  It provides guidance to your family and loved ones and reduces their decisional burden about whether or not they are making  the right decisions based on your wishes.  Follow the link provided in your after visit summary or read over the paperwork we have mailed to you to help you started getting your Advance Directives in place. If you need assistance in completing these, please reach out to us  so that we can help you!  See attachments for Preventive Care and Fall Prevention Tips.

## 2024-04-10 NOTE — Progress Notes (Signed)
 Subjective:   Colleen Elliott is a 68 y.o. who presents for a Medicare Wellness preventive visit.  As a reminder, Annual Wellness Visits don't include a physical exam, and some assessments may be limited, especially if this visit is performed virtually. We may recommend an in-person follow-up visit with your provider if needed.  Visit Complete: Virtual I connected with  Colleen Elliott on 04/10/24 by a audio enabled telemedicine application and verified that I am speaking with the correct person using two identifiers.  Patient Location: Home  Provider Location: Home Office  I discussed the limitations of evaluation and management by telemedicine. The patient expressed understanding and agreed to proceed.  Vital Signs: Because this visit was a virtual/telehealth visit, some criteria may be missing or patient reported. Any vitals not documented were not able to be obtained and vitals that have been documented are patient reported.  VideoDeclined- This patient declined Librarian, academic. Therefore the visit was completed with audio only.  Persons Participating in Visit: Patient.  AWV Questionnaire: No: Patient Medicare AWV questionnaire was not completed prior to this visit.  Cardiac Risk Factors include: advanced age (>52men, >45 women)     Objective:    Today's Vitals   04/10/24 0818  BP: 128/80  Pulse: 78  Weight: 127 lb (57.6 kg)  Height: 5' 1 (1.549 m)   Body mass index is 24 kg/m.     04/10/2024    8:22 AM 04/08/2023   11:56 AM 04/05/2022   12:04 PM 03/02/2012    6:15 AM  Advanced Directives  Does Patient Have a Medical Advance Directive? No Yes Yes Patient does not have advance directive   Type of Customer service manager Power of [place];Living will Healthcare Power of [place];Living will   Copy of Healthcare Power of Attorney in Chart?  No - copy requested No - copy requested   Pre-existing out of facility DNR order  (yellow form or pink MOST form)    No      Data saved with a previous flowsheet row definition    Current Medications (verified) Outpatient Encounter Medications as of 04/10/2024  Medication Sig   loratadine  (CLARITIN ) 10 MG tablet Take 1 tablet (10 mg total) by mouth daily.   Multiple Vitamin (MULTIVITAMIN PO) Take by mouth.   No facility-administered encounter medications on file as of 04/10/2024.    Allergies (verified) Ciprofloxacin   History: Past Medical History:  Diagnosis Date   Allergy    seasonal   Amenorrhea    Anemia    Glaucoma    trace   Hematuria    negative work up   Kidney stone    No pertinent past medical history    Osteoporosis    Pure hypercholesterolemia 01/21/2022   Past Surgical History:  Procedure Laterality Date   COLONOSCOPY     TUBAL LIGATION     Family History  Problem Relation Age of Onset   Arthritis Mother    Heart disease Father    Hyperlipidemia Brother    Uterine cancer Maternal Aunt    Ovarian cancer Maternal Aunt    Diabetes Paternal Aunt    Heart disease Paternal Aunt    Heart disease Paternal Uncle    Colon cancer Neg Hx    Colon polyps Neg Hx    Esophageal cancer Neg Hx    Rectal cancer Neg Hx    Stomach cancer Neg Hx    Social History   Socioeconomic History  Marital status: Married    Spouse name: Not on file   Number of children: Not on file   Years of education: Not on file   Highest education level: Some college, no degree  Occupational History   Not on file  Tobacco Use   Smoking status: Never   Smokeless tobacco: Never  Vaping Use   Vaping status: Never Used  Substance and Sexual Activity   Alcohol use: Yes    Alcohol/week: 1.0 standard drink of alcohol    Types: 1 Standard drinks or equivalent per week   Drug use: No   Sexual activity: Yes    Partners: Male    Birth control/protection: Surgical    Comment: tubal ligation  Other Topics Concern   Not on file  Social History Narrative   Not on  file   Social Drivers of Health   Financial Resource Strain: Low Risk  (04/10/2024)   Overall Financial Resource Strain (CARDIA)    Difficulty of Paying Living Expenses: Not hard at all  Food Insecurity: No Food Insecurity (04/10/2024)   Hunger Vital Sign    Worried About Running Out of Food in the Last Year: Never true    Ran Out of Food in the Last Year: Never true  Transportation Needs: No Transportation Needs (04/10/2024)   PRAPARE - Administrator, Civil Service (Medical): No    Lack of Transportation (Non-Medical): No  Physical Activity: Insufficiently Active (04/10/2024)   Exercise Vital Sign    Days of Exercise per Week: 2 days    Minutes of Exercise per Session: 20 min  Stress: No Stress Concern Present (04/10/2024)   [employer] of Occupational Health - Occupational Stress Questionnaire    Feeling of Stress: Not at all  Social Connections: Socially Integrated (04/10/2024)   Social Connection and Isolation Panel    Frequency of Communication with Friends and Family: More than three times a week    Frequency of Social Gatherings with Friends and Family: More than three times a week    Attends Religious Services: More than 4 times per year    Active Member of [employer] or Organizations: Yes    Attends Banker Meetings: Never    Marital Status: Married    Tobacco Counseling Counseling given: Yes    Clinical Intake:  Pre-visit preparation completed: Yes  Pain : No/denies pain     BMI - recorded: 24 Nutritional Status: BMI of 19-24  Normal Nutritional Risks: None Diabetes: No  Lab Results  Component Value Date   HGBA1C 5.3 11/23/2023     How often do you need to have someone help you when you read instructions, pamphlets, or other written materials from your doctor or pharmacy?: 1 - Never  Interpreter Needed?: No  Information entered by :: alia t/cma   Activities of Daily Living     04/10/2024    8:21 AM  In your present state  of health, do you have any difficulty performing the following activities:  Hearing? 0  Vision? 0  Comment [facility] last ov in 5/25  Difficulty concentrating or making decisions? 0  Walking or climbing stairs? 0  Dressing or bathing? 0  Doing errands, shopping? 0  Preparing Food and eating ? N  Using the Toilet? N  In the past six months, have you accidently leaked urine? N  Do you have problems with loss of bowel control? N  Managing your Medications? N  Managing your Finances? N  Housekeeping  or managing your Housekeeping? N    Patient Care Team: Severa Rock HERO, FNP as PCP - General (Family Medicine) Jannis Kate Norris, MD as Consulting Physician (Obstetrics and Gynecology)  I have updated your Care Teams any recent Medical Services you may have received from other providers in the past year.     Assessment:   This is a routine wellness examination for [facility].  Hearing/Vision screen Hearing Screening - Comments:: Pt denies hearing dif Vision Screening - Comments:: [facility] last ov in 5/25   Goals Addressed             This Visit's Progress    Exercise 3x per week (30 min per time)   On track    Increase exercise. Pt states she would like to lose some weight.        Depression Screen     04/10/2024    8:25 AM 11/23/2023    2:40 PM 04/08/2023   11:55 AM 04/05/2022   12:03 PM 01/21/2022    3:42 PM 10/06/2017    2:43 PM 09/12/2017    3:06 PM  PHQ 2/9 Scores  PHQ - 2 Score 0 0 0 0 0 0 0  PHQ- 9 Score  0   0      Fall Risk     04/10/2024    8:20 AM 11/23/2023    2:39 PM 04/08/2023   11:54 AM 04/05/2022   12:05 PM 10/06/2017    2:43 PM  Fall Risk   Falls in the past year? 0 0 0 0 No   Number falls in past yr: 0 0 0 0   Injury with Fall? 0 0 0 0   Risk for fall due to : No Fall Risks No Fall Risks No Fall Risks No Fall Risks   Follow up Falls evaluation completed Falls evaluation completed Falls prevention discussed Falls prevention  discussed       Data saved with a previous flowsheet row definition    MEDICARE RISK AT HOME:  Medicare Risk at Home Any stairs in or around the home?: Yes If so, are there any without handrails?: Yes Home free of loose throw rugs in walkways, pet beds, electrical cords, etc?: Yes Adequate lighting in your home to reduce risk of falls?: Yes Life alert?: No Use of a cane, walker or w/c?: No Grab bars in the bathroom?: Yes Shower chair or bench in shower?: No Elevated toilet seat or a handicapped toilet?: No  TIMED UP AND GO:  Was the test performed?  no  Cognitive Function: 6CIT completed        04/10/2024    8:26 AM 04/08/2023   11:56 AM 04/05/2022   12:07 PM  6CIT Screen  What Year? 0 points 0 points 0 points  What month? 0 points 0 points 0 points  What time? 0 points 0 points 0 points  Count back from 20 0 points 0 points 0 points  Months in reverse 0 points 0 points 0 points  Repeat phrase 0 points 0 points 0 points  Total Score 0 points 0 points 0 points    Immunizations Immunization History  Administered Date(s) Administered   Tdap 08/22/2019    Screening Tests Health Maintenance  Topic Date Due   Zoster Vaccines- Shingrix (1 of 2) Never done   COVID-19 Vaccine (1 - 2024-25 season) 04/26/2024 (Originally 05/29/2023)   Pneumococcal Vaccine: 50+ Years (1 of 1 - PCV) 11/22/2024 (Originally 03/29/2006)   INFLUENZA VACCINE  04/27/2024   Medicare Annual Wellness (AWV)  04/10/2025   MAMMOGRAM  10/18/2025   DEXA SCAN  11/23/2025   DTaP/Tdap/Td (2 - Td or Tdap) 08/21/2029   Hepatitis C Screening  Completed   Hepatitis B Vaccines  Aged Out   HPV VACCINES  Aged Out   Meningococcal B Vaccine  Aged Out   Colonoscopy  Discontinued    Health Maintenance  Health Maintenance Due  Topic Date Due   Zoster Vaccines- Shingrix (1 of 2) Never done   Health Maintenance Items Addressed: See Nurse Notes at the end of this note  Additional Screening:  Vision Screening:  Recommended annual ophthalmology exams for early detection of glaucoma and other disorders of the eye. Would you like a referral to an eye doctor? No    Dental Screening: Recommended annual dental exams for proper oral hygiene  Community Resource Referral / Chronic Care Management: CRR required this visit?  No   CCM required this visit?  No   Plan:    I have personally reviewed and noted the following in the patient's chart:   Medical and social history Use of alcohol, tobacco or illicit drugs  Current medications and supplements including opioid prescriptions. Patient is not currently taking opioid prescriptions. Functional ability and status Nutritional status Physical activity Advanced directives List of other physicians Hospitalizations, surgeries, and ER visits in previous 12 months Vitals Screenings to include cognitive, depression, and falls Referrals and appointments  In addition, I have reviewed and discussed with patient certain preventive protocols, quality metrics, and best practice recommendations. A written personalized care plan for preventive services as well as general preventive health recommendations were provided to patient.   Ozie Ned, CMA   04/10/2024   After Visit Summary: (MyChart) Due to this being a telephonic visit, the after visit summary with patients personalized plan was offered to patient via MyChart   Notes: Nothing significant to report at this time.

## 2024-08-28 DIAGNOSIS — H40053 Ocular hypertension, bilateral: Secondary | ICD-10-CM | POA: Diagnosis not present

## 2024-08-28 DIAGNOSIS — H21233 Degeneration of iris (pigmentary), bilateral: Secondary | ICD-10-CM | POA: Diagnosis not present

## 2024-08-28 DIAGNOSIS — Z961 Presence of intraocular lens: Secondary | ICD-10-CM | POA: Diagnosis not present

## 2024-11-23 ENCOUNTER — Encounter: Payer: Medicare HMO | Admitting: Family Medicine

## 2025-04-11 ENCOUNTER — Ambulatory Visit: Payer: Self-pay
